# Patient Record
Sex: Male | Born: 1945 | Race: White | Hispanic: No | Marital: Married | Smoking: Never smoker
Health system: Southern US, Community
[De-identification: ages and names within clinical notes are randomized; demographics above are authoritative.]

## PROBLEM LIST (undated history)

## (undated) DIAGNOSIS — Q6602 Congenital talipes equinovarus, left foot: Secondary | ICD-10-CM

## (undated) DIAGNOSIS — Q6689 Other  specified congenital deformities of feet: Secondary | ICD-10-CM

## (undated) DIAGNOSIS — L0501 Pilonidal cyst with abscess: Secondary | ICD-10-CM

## (undated) DIAGNOSIS — Q6601 Congenital talipes equinovarus, right foot: Secondary | ICD-10-CM

## (undated) DIAGNOSIS — I10 Essential (primary) hypertension: Secondary | ICD-10-CM

## (undated) DIAGNOSIS — J309 Allergic rhinitis, unspecified: Secondary | ICD-10-CM

## (undated) DIAGNOSIS — N200 Calculus of kidney: Secondary | ICD-10-CM

## (undated) DIAGNOSIS — N400 Enlarged prostate without lower urinary tract symptoms: Secondary | ICD-10-CM

## (undated) DIAGNOSIS — B369 Superficial mycosis, unspecified: Secondary | ICD-10-CM

## (undated) HISTORY — DX: Allergic rhinitis, unspecified: J30.9

## (undated) HISTORY — DX: Congenital talipes equinovarus, left foot: Q66.02

## (undated) HISTORY — PX: CLUB FOOT RELEASE: SHX1363

## (undated) HISTORY — DX: Superficial mycosis, unspecified: B36.9

## (undated) HISTORY — DX: Benign prostatic hyperplasia without lower urinary tract symptoms: N40.0

## (undated) HISTORY — DX: Pilonidal cyst with abscess: L05.01

## (undated) HISTORY — DX: Congenital talipes equinovarus, right foot: Q66.01

## (undated) HISTORY — DX: Other specified congenital deformities of feet: Q66.89

## (undated) HISTORY — DX: Calculus of kidney: N20.0

## (undated) HISTORY — DX: Essential (primary) hypertension: I10

---

## 2000-03-17 ENCOUNTER — Ambulatory Visit (HOSPITAL_COMMUNITY): Admission: RE | Admit: 2000-03-17 | Discharge: 2000-03-17 | Payer: Self-pay | Admitting: Orthopaedic Surgery

## 2000-03-17 ENCOUNTER — Encounter: Payer: Self-pay | Admitting: Orthopaedic Surgery

## 2005-11-11 ENCOUNTER — Ambulatory Visit: Payer: Self-pay | Admitting: Family Medicine

## 2007-01-25 DIAGNOSIS — T7840XA Allergy, unspecified, initial encounter: Secondary | ICD-10-CM | POA: Insufficient documentation

## 2007-01-28 ENCOUNTER — Ambulatory Visit: Payer: Self-pay | Admitting: Family Medicine

## 2007-01-28 DIAGNOSIS — I10 Essential (primary) hypertension: Secondary | ICD-10-CM

## 2007-01-28 DIAGNOSIS — Z85038 Personal history of other malignant neoplasm of large intestine: Secondary | ICD-10-CM

## 2007-01-28 HISTORY — DX: Essential (primary) hypertension: I10

## 2007-03-02 ENCOUNTER — Telehealth: Payer: Self-pay | Admitting: Family Medicine

## 2007-07-07 ENCOUNTER — Ambulatory Visit: Payer: Self-pay | Admitting: Family Medicine

## 2007-07-07 LAB — CONVERTED CEMR LAB
ALT: 22 units/L (ref 0–53)
AST: 21 units/L (ref 0–37)
Albumin: 4.1 g/dL (ref 3.5–5.2)
Alkaline Phosphatase: 56 units/L (ref 39–117)
BUN: 15 mg/dL (ref 6–23)
Basophils Absolute: 0 10*3/uL (ref 0.0–0.1)
Basophils Relative: 0.3 % (ref 0.0–1.0)
Bilirubin Urine: NEGATIVE
Bilirubin, Direct: 0.1 mg/dL (ref 0.0–0.3)
Blood in Urine, dipstick: NEGATIVE
CO2: 28 meq/L (ref 19–32)
Calcium: 9.1 mg/dL (ref 8.4–10.5)
Chloride: 105 meq/L (ref 96–112)
Cholesterol: 257 mg/dL (ref 0–200)
Creatinine, Ser: 1 mg/dL (ref 0.4–1.5)
Direct LDL: 189.3 mg/dL
Eosinophils Absolute: 0.2 10*3/uL (ref 0.0–0.7)
Eosinophils Relative: 3.1 % (ref 0.0–5.0)
GFR calc Af Amer: 98 mL/min
GFR calc non Af Amer: 81 mL/min
Glucose, Bld: 97 mg/dL (ref 70–99)
Glucose, Urine, Semiquant: NEGATIVE
HCT: 44.1 % (ref 39.0–52.0)
HDL: 32.8 mg/dL — ABNORMAL LOW (ref >39.0)
Hemoglobin: 15.1 g/dL (ref 13.0–17.0)
Ketones, urine, test strip: NEGATIVE
Lymphocytes Relative: 28 % (ref 12.0–46.0)
MCHC: 34.2 g/dL (ref 30.0–36.0)
MCV: 88.6 fL (ref 78.0–100.0)
Monocytes Absolute: 0.7 10*3/uL (ref 0.1–1.0)
Monocytes Relative: 10.7 % (ref 3.0–12.0)
Neutro Abs: 3.7 10*3/uL (ref 1.4–7.7)
Neutrophils Relative %: 57.9 % (ref 43.0–77.0)
Nitrite: NEGATIVE
PSA: 0.86 ng/mL (ref 0.10–4.00)
Platelets: 224 10*3/uL (ref 150–400)
Potassium: 3.8 meq/L (ref 3.5–5.1)
Protein, U semiquant: NEGATIVE
RBC: 4.98 M/uL (ref 4.22–5.81)
RDW: 11.9 % (ref 11.5–14.6)
Sodium: 140 meq/L (ref 135–145)
Specific Gravity, Urine: 1.015
TSH: 1.5 microintl units/mL (ref 0.35–5.50)
Total Bilirubin: 1.3 mg/dL — ABNORMAL HIGH (ref 0.3–1.2)
Total CHOL/HDL Ratio: 7.8
Total Protein: 6.4 g/dL (ref 6.0–8.3)
Triglycerides: 102 mg/dL (ref 0–149)
Urobilinogen, UA: 0.2
VLDL: 20 mg/dL (ref 0–40)
WBC Urine, dipstick: NEGATIVE
WBC: 6.4 10*3/uL (ref 4.5–10.5)
pH: 5.5

## 2007-07-15 ENCOUNTER — Ambulatory Visit: Payer: Self-pay | Admitting: Family Medicine

## 2007-08-05 ENCOUNTER — Ambulatory Visit: Payer: Self-pay | Admitting: Internal Medicine

## 2007-08-06 DIAGNOSIS — L0501 Pilonidal cyst with abscess: Secondary | ICD-10-CM

## 2007-08-06 HISTORY — DX: Pilonidal cyst with abscess: L05.01

## 2007-08-11 ENCOUNTER — Telehealth: Payer: Self-pay | Admitting: *Deleted

## 2007-08-13 ENCOUNTER — Ambulatory Visit: Payer: Self-pay | Admitting: Family Medicine

## 2007-08-19 ENCOUNTER — Encounter: Payer: Self-pay | Admitting: Internal Medicine

## 2007-08-19 ENCOUNTER — Ambulatory Visit: Payer: Self-pay | Admitting: Internal Medicine

## 2007-08-20 ENCOUNTER — Telehealth: Payer: Self-pay | Admitting: Internal Medicine

## 2007-08-23 ENCOUNTER — Encounter: Payer: Self-pay | Admitting: Internal Medicine

## 2007-08-25 ENCOUNTER — Encounter: Admission: RE | Admit: 2007-08-25 | Discharge: 2007-08-25 | Payer: Self-pay | Admitting: Sports Medicine

## 2009-03-03 ENCOUNTER — Telehealth: Payer: Self-pay | Admitting: Family Medicine

## 2009-03-17 ENCOUNTER — Telehealth: Payer: Self-pay | Admitting: Family Medicine

## 2009-03-24 ENCOUNTER — Ambulatory Visit: Payer: Self-pay | Admitting: Family Medicine

## 2009-03-24 LAB — CONVERTED CEMR LAB
ALT: 22 U/L
AST: 23 U/L
Albumin: 4.3 g/dL
Alkaline Phosphatase: 68 U/L
BUN: 14 mg/dL
Basophils Absolute: 0 K/uL
Basophils Relative: 0.1 %
Bilirubin Urine: NEGATIVE
Bilirubin, Direct: 0.1 mg/dL
CO2: 29 meq/L
Calcium: 9.2 mg/dL
Chloride: 106 meq/L
Cholesterol: 241 mg/dL — ABNORMAL HIGH
Creatinine, Ser: 1.1 mg/dL
Direct LDL: 177.8 mg/dL
Eosinophils Absolute: 0.1 K/uL
Eosinophils Relative: 1.1 %
GFR calc non Af Amer: 71.8 mL/min
Glucose, Bld: 104 mg/dL — ABNORMAL HIGH
Glucose, Urine, Semiquant: NEGATIVE
HCT: 46.2 %
HDL: 45.3 mg/dL
Hemoglobin: 15.1 g/dL
Ketones, urine, test strip: NEGATIVE
Lymphocytes Relative: 19.4 %
Lymphs Abs: 1.3 K/uL
MCHC: 32.7 g/dL
MCV: 91.9 fL
Monocytes Absolute: 0.5 K/uL
Monocytes Relative: 7.7 %
Neutro Abs: 5 K/uL
Neutrophils Relative %: 71.7 %
Nitrite: NEGATIVE
PSA: 0.91 ng/mL
Platelets: 231 K/uL
Potassium: 4.2 meq/L
Protein, U semiquant: NEGATIVE
RBC: 5.03 M/uL
RDW: 12.2 %
Sodium: 140 meq/L
Specific Gravity, Urine: 1.015
TSH: 1.17 u[IU]/mL
Total Bilirubin: 0.9 mg/dL
Total CHOL/HDL Ratio: 5
Total Protein: 6.9 g/dL
Triglycerides: 118 mg/dL
Urobilinogen, UA: 0.2
VLDL: 23.6 mg/dL
WBC Urine, dipstick: NEGATIVE
WBC: 6.9 10*3/microliter
pH: 7

## 2009-06-19 ENCOUNTER — Ambulatory Visit: Payer: Self-pay | Admitting: Family Medicine

## 2009-07-23 ENCOUNTER — Emergency Department (HOSPITAL_COMMUNITY): Admission: EM | Admit: 2009-07-23 | Discharge: 2009-07-24 | Payer: Self-pay | Admitting: Emergency Medicine

## 2009-11-08 ENCOUNTER — Ambulatory Visit: Payer: Self-pay | Admitting: Family Medicine

## 2009-11-30 ENCOUNTER — Ambulatory Visit: Payer: Self-pay | Admitting: Family Medicine

## 2009-11-30 DIAGNOSIS — B369 Superficial mycosis, unspecified: Secondary | ICD-10-CM | POA: Insufficient documentation

## 2009-11-30 HISTORY — DX: Superficial mycosis, unspecified: B36.9

## 2010-03-27 NOTE — Assessment & Plan Note (Signed)
Summary: privacy issues//ccm   Vital Signs:  Patient profile:   65 year old male Weight:      191 pounds Temp:     98.1 degrees F oral BP sitting:   160 / 90  (left arm) Cuff size:   regular  Vitals Entered By: Kern Reap CMA Duncan Dull) (November 30, 2009 9:40 AM) CC: sores on penis   CC:  sores on penis.  History of Present Illness: Chad Yoder is a 65 year old, married male, nonsmoker, who comes in today for evaluation of irritation of the head of his penis over the last couple years.  He has episodes once or twice a year.  It usually occurs when he travels a lot.  It gets red and sore.  He tried different other appointments none of which is really worked except the anti-fungal ointment.  Allergies: 1)  ! * Codiene 2)  ! Sulfa  Past History:  Past medical, surgical, family and social histories (including risk factors) reviewed for relevance to current acute and chronic problems.  Past Medical History: Reviewed history from 08/13/2007 and no changes required. Bph ar Hypertension fractured left tibia clubfeet as a child had splinting and surgery pilonidal cyst  Family History: Reviewed history from 01/28/2007 and no changes required. Family History of Colon CA 1st degree relative <60 Family History Hypertension  Social History: Reviewed history from 01/28/2007 and no changes required. Retired Married Never Smoked Alcohol use-no Drug use-no Regular exercise-no  Review of Systems      See HPI  Physical Exam  General:  Well-developed,well-nourished,in no acute distress; alert,appropriate and cooperative throughout examination Genitalia:  he is uncircumcised.  When he pulls the skin, back, over the head of the penis.  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 Take 1 tablet by mouth every morning 2)  Flector 1.3 % Ptch (Diclofenac epolamine) .... As needed 3)  Allegra Allergy 180 Mg Tabs (Fexofenadine hcl) .... Once daily  Patient Instructions: 1)  apply OTC anti-fungal cream twice daily until the redness resolves. 2)  Please schedule a follow-up appointment as needed.    Prevention & Chronic Care Immunizations   Influenza vaccine: Not documented    Tetanus booster: 06/19/2009: Tdap    Pneumococcal vaccine: Not documented    H. zoster vaccine: Not documented  Colorectal Screening   Hemoccult: Not documented    Colonoscopy: Location:  [facility].    (08/19/2007)   Colonoscopy due: 08/2012  Other Screening   PSA: 0.91  (03/24/2009)   Smoking status: never  (01/28/2007)  Lipids   Total Cholesterol: 241  (03/24/2009)   LDL: DEL  (07/07/2007)   LDL Direct: 177.8  (03/24/2009)   HDL: 45.30  (03/24/2009)   Triglycerides: 118.0  (03/24/2009)  Hypertension   Last Blood Pressure: 160 / 90  (11/30/2009)   Serum creatinine: 1.1  (03/24/2009)   Serum potassium 4.2  (03/24/2009)  Self-Management Support :    Hypertension self-management support: Not documented

## 2010-03-27 NOTE — Assessment & Plan Note (Signed)
Summary: INFECTION ON TAILBONE//SLM   Vital Signs:  Patient profile:   65 year old male Weight:      195 pounds Temp:     98.4 degrees F BP sitting:   160 / 68  (right arm) Cuff size:   regular  Vitals Entered By: Kathrynn Speed CMA (November 08, 2009 10:39 AM) CC: infection on tailbone, src   CC:  infection on tailbone and src.  History of Present Illness: Chad Yoder is a 65 year old, married male, nonsmoker, who comes in today for evaluation of soreness around his tailbone for the past two weeks.  He said recurrent episodes of pilonidal cyst inflammation.  It is never had a completely excised.  He has no fever, chills, distant tenderness.  Current Medications (verified): 1)  Zestril 40 Mg  Tabs (Lisinopril) .... Take 1 Tablet By Mouth Every Morning 2)  Flector 1.3 % Ptch (Diclofenac Epolamine) .... As Needed 3)  Allegra Allergy 180 Mg Tabs (Fexofenadine Hcl) .... Once Daily  Allergies (verified): 1)  ! * Codiene 2)  ! Sulfa  Past History:  Past medical, surgical, family and social histories (including risk factors) reviewed for relevance to current acute and chronic problems.  Past Medical History: Reviewed history from 08/13/2007 and no changes required. Bph ar Hypertension fractured left tibia clubfeet as a child had splinting and surgery pilonidal cyst  Family History: Reviewed history from 01/28/2007 and no changes required. Family History of Colon CA 1st degree relative <60 Family History Hypertension  Social History: Reviewed history from 01/28/2007 and no changes required. Retired Married Never Smoked Alcohol use-no Drug use-no Regular exercise-no  Review of Systems      See HPI  Physical Exam  General:  Well-developed,well-nourished,in no acute distress; alert,appropriate and cooperative throughout examination Skin:  there is some tenderness around the pilonidal area.  No frank abscess.  No erythema   Impression & Recommendations:  Problem #  1:  PILONIDAL CYST, WITH ABSCESS (ICD-685.0) Assessment Deteriorated  Orders: Prescription Created Electronically [phone])  Complete Medication List: 1)  Zestril 40 Mg Tabs (Lisinopril) .... Take 1 tablet by mouth every morning 2)  Flector 1.3 % Ptch (Diclofenac epolamine) .... As needed 3)  Allegra Allergy 180 Mg Tabs (Fexofenadine hcl) .... Once daily 4)  Keflex 500 Mg Caps (Cephalexin) .... 2 by mouth two times a day  Patient Instructions: 1)  begin Keflex 2/10 b.i.d. warm soaks 15 minutes twice daily.  Return p.r.n. Prescriptions: KEFLEX 500 MG CAPS (CEPHALEXIN) 2 by mouth two times a day  #40 x 1   Entered and Authorized by:   Roderick Pee MD   Signed by:   Roderick Pee MD on 11/08/2009   Method used:   Electronically to        [id number] (retail)       [address]       [place], [place]  [address]       Ph: [phone] or [phone]       Fax: [phone]   RxID:   [id number] KEFLEX 500 MG CAPS (CEPHALEXIN) 2 by mouth two times a day  #40 x 1   Entered and Authorized by:   Roderick Pee MD   Signed by:   Roderick Pee MD on 11/08/2009   Method used:   Print then Give to Patient   RxID:   [id number]

## 2010-03-27 NOTE — Progress Notes (Signed)
Summary: needs refill  Phone Note Call from Patient Call back at Home Phone [phone]   Caller: Patient-live call Summary of Call: pt is unable to come in for appt. Please refill his bp meds until he can be seen. [facility]. (Pt is aware that his rx was denied because needed an appt.) Initial call taken by: Warnell Forester,  March 17, 2009 1:36 PM  Follow-up for Phone Call        appointment made.  asked patient to come in fasting. Follow-up by: Kern Reap CMA Duncan Dull),  March 17, 2009 3:20 PM    Prescriptions: ZESTRIL 40 MG  TABS (LISINOPRIL) Take 1 tablet by mouth every morning  #30 x 0   Entered by:   Kern Reap CMA (AAMA)   Authorized by:   Roderick Pee MD   Signed by:   Kern Reap CMA (AAMA) on 03/17/2009   Method used:   Electronically to        [id number] (retail)       [address]       [place], [place]  [address]       Ph: [phone] or [phone]       Fax: [phone]   RxID:   [id number]

## 2010-03-27 NOTE — Assessment & Plan Note (Signed)
Summary: follow up meds - rv   Vital Signs:  Patient profile:   65 year old male Height:      68.5 inches Weight:      196 pounds BMI:     29.47 Temp:     98.5 degrees F oral BP sitting:   120 / 88  (left arm) Cuff size:   regular  Vitals Entered By: Kern Reap CMA Duncan Dull) (March 24, 2009 9:21 AM)  Reason for Visit follow up meds  History of Present Illness: Chad Yoder is a 65 year old male, who was last here about a year and a half ago, who comes in today because we would no longer continue to continue refill.  His medication without physical exam he is not very happy.  I explained that this is a requirement that we see him once a year for a complete physical examination and blood work.  He takes Zestril 40 mg daily.  BP by me 150/80.  He also takes Allegra 60 mg daily for allergic rhinitis.  Is otherwise in good health.  No problems except he has a skin tag on his neck that he would like excised.  The  Allergies: 1)  ! * Codiene 2)  ! Sulfa  Past History:  Past medical, surgical, family and social histories (including risk factors) reviewed for relevance to current acute and chronic problems.  Past Medical History: Reviewed history from 08/13/2007 and no changes required. Bph ar Hypertension fractured left tibia clubfeet as a child had splinting and surgery pilonidal cyst  Family History: Reviewed history from 01/28/2007 and no changes required. Family History of Colon CA 1st degree relative <60 Family History Hypertension  Social History: Reviewed history from 01/28/2007 and no changes required. Retired Married Never Smoked Alcohol use-no Drug use-no Regular exercise-no  Review of Systems      See HPI  Physical Exam  General:  Well-developed,well-nourished,in no acute distress; alert,appropriate and cooperative throughout examination Heart:  150/80, pulse 90, however, he slightly upset about having to come to the office   Impression &  Recommendations:  Problem # 1:  HYPERTENSION (ICD-401.9) Assessment Unchanged  The following medications were removed from the medication list:    Zestril 20 Mg Tabs (Lisinopril) .Marland Kitchen... Take 1 tablet by mouth every morning His updated medication list for this problem includes:    Zestril 40 Mg Tabs (Lisinopril) .Marland Kitchen... Take 1 tablet by mouth every morning  Orders: Venipuncture (16109) UA Dipstick w/o Micro (automated)  (81003) TLB-Lipid Panel (80061-LIPID) TLB-BMP (Basic Metabolic Panel-BMET) (80048-METABOL) TLB-CBC Platelet - w/Differential (85025-CBCD) TLB-Hepatic/Liver Function Pnl (80076-HEPATIC) TLB-TSH (Thyroid Stimulating Hormone) (84443-TSH) TLB-PSA (Prostate Specific Antigen) (84153-PSA)  Complete Medication List: 1)  Fexofenadine Hcl 60 Mg Tabs (Fexofenadine hcl) .... Take daily as needed for allergies 2)  Zestril 40 Mg Tabs (Lisinopril) .... Take 1 tablet by mouth every morning 3)  Doxycycline Hyclate 100 Mg Tabs (Doxycycline hyclate) .... Take 1 tablet by mouth two times a day 4)  Flector 1.3 % Ptch (Diclofenac epolamine) .... As needed  Patient Instructions: 1)  continue lisinopril 40 mg daily.  Check a morning blood pressure daily.  Return in 4 to 6 weeks for a 30 minute appointment. 2)  We will do all your laboratory.  Today Prescriptions: ZESTRIL 40 MG  TABS (LISINOPRIL) Take 1 tablet by mouth every morning  #100 x 3   Entered and Authorized by:   Roderick Pee MD   Signed by:   Roderick Pee MD on 03/24/2009  Method used:   Print then Give to Patient   RxID:   [id number] FEXOFENADINE HCL 60 MG TABS (FEXOFENADINE HCL) take daily as needed for allergies  #100 x 3   Entered and Authorized by:   Roderick Pee MD   Signed by:   Roderick Pee MD on 03/24/2009   Method used:   Print then Give to Patient   RxID:   [id number]   Laboratory Results   Urine Tests    Routine Urinalysis   Color: yellow Appearance: Clear Glucose: negative   (Normal  Range: Negative) Bilirubin: negative   (Normal Range: Negative) Ketone: negative   (Normal Range: Negative) Spec. Gravity: 1.015   (Normal Range: 1.003-1.035) Blood: trace-intact   (Normal Range: Negative) pH: 7.0   (Normal Range: 5.0-8.0) Protein: negative   (Normal Range: Negative) Urobilinogen: 0.2   (Normal Range: 0-1) Nitrite: negative   (Normal Range: Negative) Leukocyte Esterace: negative   (Normal Range: Negative)    Comments: Rita Ohara  March 24, 2009 2:22 PM

## 2010-03-27 NOTE — Progress Notes (Signed)
Summary: refill  Phone Note Refill Request Message from:  Fax from Pharmacy  Refills Requested: Medication #1:  ZESTRIL 20 MG  TABS Take 1 tablet by mouth every morning   Brand Name Necessary? No   Last Ref[facility]: [facility] [facility]----[phone]      fax-----[phone]  Initial call taken by: Warnell Forester,  March 03, 2009 8:23 AM  Follow-up for Phone Call        denied Follow-up by: Kern Reap CMA Duncan Dull),  March 03, 2009 1:33 PM

## 2010-03-27 NOTE — Assessment & Plan Note (Signed)
Summary: 6 WK ROV // RS   Vital Signs:  Patient profile:   65 year old male Height:      68.5 inches Weight:      194 pounds Temp:     98.4 degrees F oral BP sitting:   140 / 80  (left arm) Cuff size:   regular  Vitals Entered By: Kern Reap CMA Duncan Dull) (June 19, 2009 2:44 PM) CC: follow-up visit   CC:  follow-up visit.  History of Present Illness: Chad Yoder is a 65 year old, married male, nonsmoker, who comes in today for evaluation of hypertension, and allergic rhinitis.  His hypertension is treated with Zestril 40 mg daily.  BP 140/80.  He takes Allegra 60 mg daily for allergic rhinitis.  he gets routine eye  care, and dental care.  Colonoscopy normal in GI.  Tetanus booster?.  He declines flu shot  he declines a prostate exam he states his urologist will do this for him  Allergies: 1)  ! * Codiene 2)  ! Sulfa  Past History:  Past medical, surgical, family and social histories (including risk factors) reviewed, and no changes noted (except as noted below).  Past Medical History: Reviewed history from 08/13/2007 and no changes required. Bph ar Hypertension fractured left tibia clubfeet as a child had splinting and surgery pilonidal cyst  Family History: Reviewed history from 01/28/2007 and no changes required. Family History of Colon CA 1st degree relative <60 Family History Hypertension  Social History: Reviewed history from 01/28/2007 and no changes required. Retired Married Never Smoked Alcohol use-no Drug use-no Regular exercise-no  Review of Systems      See HPI  Physical Exam  General:  Well-developed,well-nourished,in no acute distress; alert,appropriate and cooperative throughout examination Head:  Normocephalic and atraumatic without obvious abnormalities. No apparent alopecia or balding. Eyes:  No corneal or conjunctival inflammation noted. EOMI. Perrla. Funduscopic exam benign, without hemorrhages, exudates or papilledema. Vision grossly  normal. Ears:  External ear exam shows no significant lesions or deformities.  Otoscopic examination reveals clear canals, tympanic membranes are intact bilaterally without bulging, retraction, inflammation or discharge. Hearing is grossly normal bilaterally. Nose:  External nasal examination shows no deformity or inflammation. Nasal mucosa are pink and moist without lesions or exudates. Mouth:  Oral mucosa and oropharynx without lesions or exudates.  Teeth in good repair. Neck:  No deformities, masses, or tenderness noted. Chest Wall:  No deformities, masses, tenderness or gynecomastia noted. Breasts:  No masses or gynecomastia noted Lungs:  Normal respiratory effort, chest expands symmetrically. Lungs are clear to auscultation, no crackles or wheezes. Heart:  Normal rate and regular rhythm. S1 and S2 normal without gallop, murmur, click, rub or other extra sounds. Abdomen:  Bowel sounds positive,abdomen soft and non-tender without masses, organomegaly or hernias noted. Rectal:  No external abnormalities noted. Normal sphincter tone. No rectal masses or tenderness. Genitalia:  Testes bilaterally descended without nodularity, tenderness or masses. No scrotal masses or lesions. No penis lesions or urethral discharge. Prostate:  Prostate gland firm and smooth, no enlargement, nodularity, tenderness, mass, asymmetry or induration. Msk:  No deformity or scoliosis noted of thoracic or lumbar spine.   Pulses:  R and L carotid,radial,femoral,dorsalis pedis and posterior tibial pulses are full and equal bilaterally Extremities:  No clubbing, cyanosis, edema, or deformity noted with normal full range of motion of all joints.   Neurologic:  No cranial nerve deficits noted. Station and gait are normal. Plantar reflexes are down-going bilaterally. DTRs are symmetrical throughout. Sensory, motor and  coordinative functions appear intact. Skin:  Intact without suspicious lesions or rashes Cervical Nodes:  No  lymphadenopathy noted Axillary Nodes:  No palpable lymphadenopathy Inguinal Nodes:  No significant adenopathy Psych:  Cognition and judgment appear intact. Alert and cooperative with normal attention span and concentration. No apparent delusions, illusions, hallucinations   Impression & Recommendations:  Problem # 1:  HYPERTENSION (ICD-401.9) Assessment Improved  His updated medication list for this problem includes:    Zestril 40 Mg Tabs (Lisinopril) .Marland Kitchen... Take 1 tablet by mouth every morning  Orders: EKG w/ Interpretation (93000)  Problem # 2:  Preventive Health Care (ICD-V70.0) Assessment: Unchanged  Orders: EKG w/ Interpretation (93000)  Complete Medication List: 1)  Fexofenadine Hcl 60 Mg Tabs (Fexofenadine hcl) .... Take daily as needed for allergies 2)  Zestril 40 Mg Tabs (Lisinopril) .... Take 1 tablet by mouth every morning 3)  Flector 1.3 % Ptch (Diclofenac epolamine) .... As needed  Other Orders: Tdap => 78yrs IM (65784) Admin 1st Vaccine (69629)  Patient Instructions: 1)  Please schedule a follow-up appointment in 1 year. Prescriptions: ZESTRIL 40 MG  TABS (LISINOPRIL) Take 1 tablet by mouth every morning  #100 x 3   Entered and Authorized by:   Roderick Pee MD   Signed by:   Roderick Pee MD on 06/19/2009   Method used:   Print then Give to Patient   RxID:   [id number] FEXOFENADINE HCL 60 MG TABS (FEXOFENADINE HCL) take daily as needed for allergies  #100 x 3   Entered and Authorized by:   Roderick Pee MD   Signed by:   Roderick Pee MD on 06/19/2009   Method used:   Print then Give to Patient   RxID:   [id number] ZESTRIL 40 MG  TABS (LISINOPRIL) Take 1 tablet by mouth every morning  #100 x 3   Entered and Authorized by:   Roderick Pee MD   Signed by:   Roderick Pee MD on 06/19/2009   Method used:   Electronically to        [facility]* (retail)       [address]       [place], [place]  [address]       Ph:  [phone]       Fax: [phone]   RxID:   [id number] FEXOFENADINE HCL 60 MG TABS (FEXOFENADINE HCL) take daily as needed for allergies  #100 x 3   Entered and Authorized by:   Roderick Pee MD   Signed by:   Roderick Pee MD on 06/19/2009   Method used:   Electronically to        [facility]* (retail)       [address]       [place], [place]  [address]       Ph: [phone]       Fax: [phone]   RxID:   [id number]    Immunizations Administered:  Tetanus Vaccine:    Vaccine Type: Tdap    Site: right deltoid    Mfr: [employer]    Dose: 0.5 ml    Route: IM    Given by: Kern Reap CMA (AAMA)    Exp. Date: 05/20/2011    Lot #: [id number]    Physician counseled: yes

## 2010-04-18 ENCOUNTER — Other Ambulatory Visit: Payer: Self-pay | Admitting: Dermatology

## 2010-05-14 LAB — URINALYSIS, ROUTINE W REFLEX MICROSCOPIC
Bilirubin Urine: NEGATIVE
Leukocytes, UA: NEGATIVE
Nitrite: NEGATIVE
Specific Gravity, Urine: 1.029 (ref 1.005–1.030)
Urobilinogen, UA: 0.2 mg/dL (ref 0.0–1.0)

## 2010-05-14 LAB — BASIC METABOLIC PANEL
BUN: 19 mg/dL (ref 6–23)
Creatinine, Ser: 1.23 mg/dL (ref 0.4–1.5)
GFR calc Af Amer: >60 mL/min (ref >60)
GFR calc non Af Amer: 59 mL/min — ABNORMAL LOW (ref >60)
Potassium: 4 mEq/L (ref 3.5–5.1)

## 2010-05-14 LAB — DIFFERENTIAL
Eosinophils Relative: 1 % (ref 0–5)
Lymphocytes Relative: 15 % (ref 12–46)
Lymphs Abs: 1.7 10*3/uL (ref 0.7–4.0)
Neutro Abs: 9 10*3/uL — ABNORMAL HIGH (ref 1.7–7.7)
Neutrophils Relative %: 79 % — ABNORMAL HIGH (ref 43–77)

## 2010-05-14 LAB — URINE MICROSCOPIC-ADD ON

## 2010-05-14 LAB — CBC
HCT: 41.3 % (ref 39.0–52.0)
Platelets: 232 10*3/uL (ref 150–400)
WBC: 11.3 10*3/uL — ABNORMAL HIGH (ref 4.0–10.5)

## 2010-05-14 LAB — URINE CULTURE: Culture: NO GROWTH

## 2010-08-07 ENCOUNTER — Other Ambulatory Visit: Payer: Self-pay | Admitting: Family Medicine

## 2010-10-04 ENCOUNTER — Other Ambulatory Visit: Payer: Self-pay | Admitting: Family Medicine

## 2010-10-04 MED ORDER — CEPHALEXIN 500 MG PO CAPS: 500.0000 mg | ORAL_CAPSULE | Freq: Two times a day (BID) | ORAL | Status: AC

## 2010-10-04 NOTE — Telephone Encounter (Signed)
Keflex 500 mg dispense 40 tabs directions two p.o. B.i.d., refills x 1

## 2010-10-04 NOTE — Telephone Encounter (Signed)
Left message on machine for patient  rx sent 

## 2010-10-04 NOTE — Telephone Encounter (Signed)
Pt is taking trip out of country and is req to get a script for Cephalexin 500mg . Pls call in to [facility]

## 2010-11-04 IMAGING — CT CT ABD-PELV W/O CM
2 of 4 series · 17 of 46 positions shown, 19 images · non-contrast
Comparison: None.

CLINICAL DATA: Left flank pain and hematuria.

CT ABDOMEN AND PELVIS WITHOUT CONTRAST
TECHNIQUE: Multidetector CT imaging of the abdomen and pelvis was
performed following the standard protocol without intravenous
contrast.

[Series 2: under 200# stone no prev · axial · 0.74mm/px · z∈[-456,-86]mm · 14 of 82 slices shown, 16 images]
[im 4/82  soft-tissue]
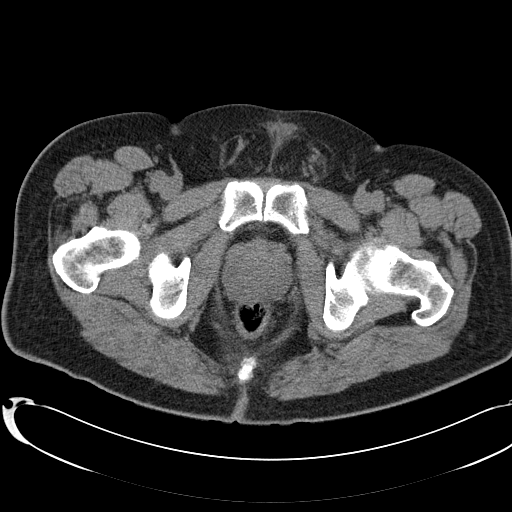
[im 4/82  bone]
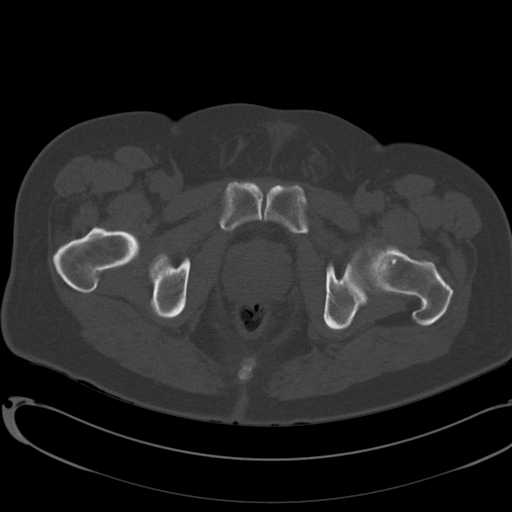
[im 10/82  soft-tissue]
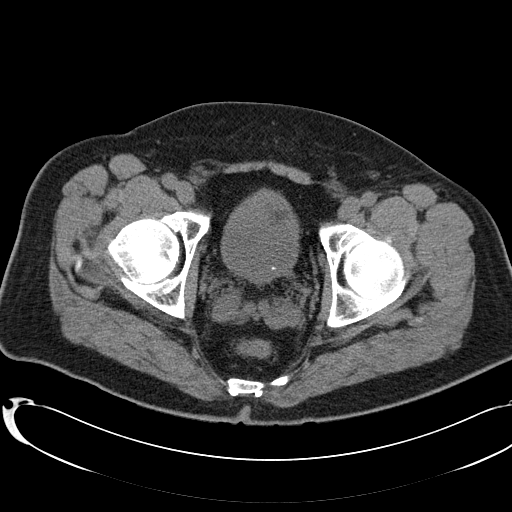
[im 17/82  soft-tissue]
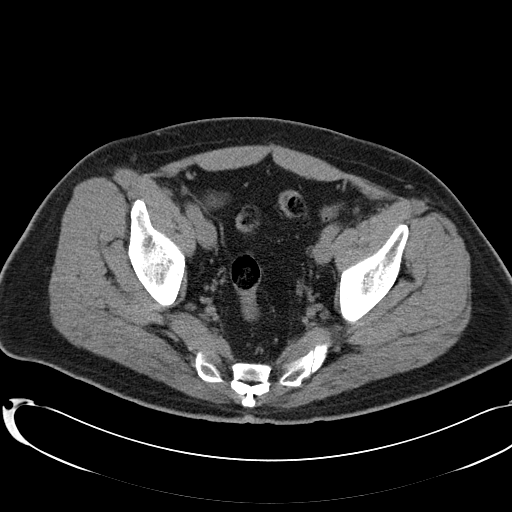
[im 23/82  soft-tissue]
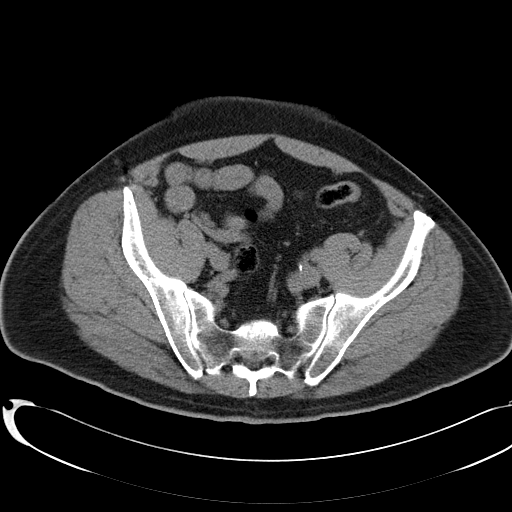
[im 26/82  soft-tissue]
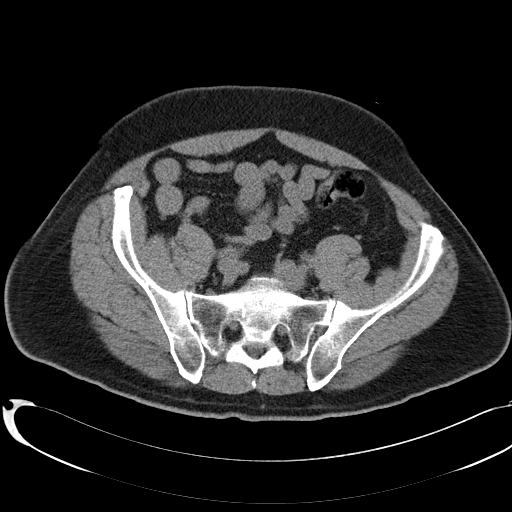
[im 33/82  soft-tissue]
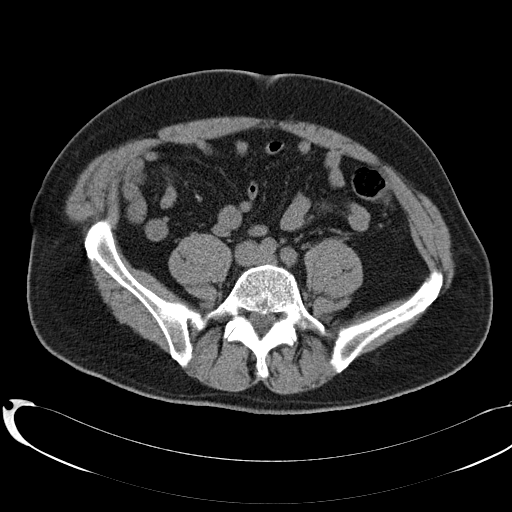
[im 39/82  soft-tissue]
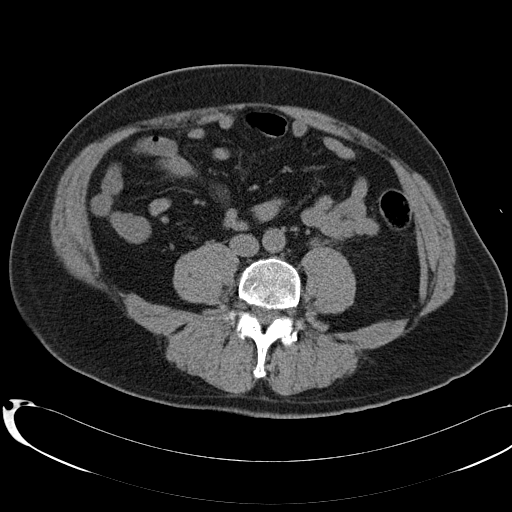
[im 43/82  soft-tissue]
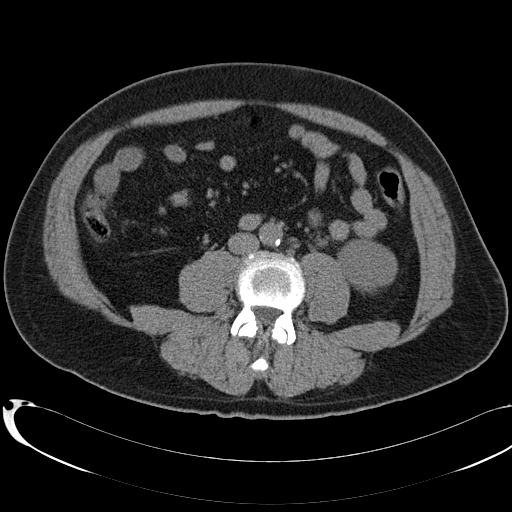
[im 49/82  soft-tissue]
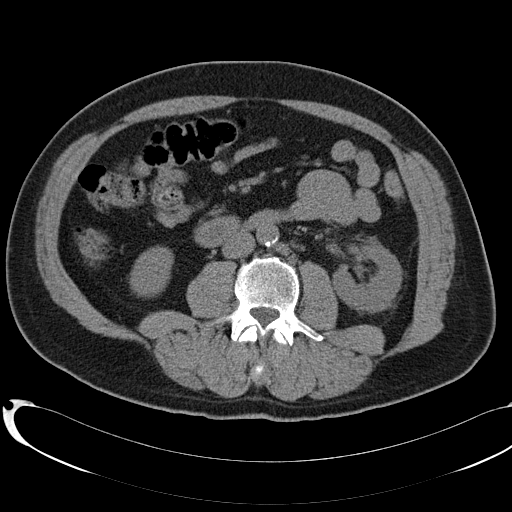
[im 49/82  bone]
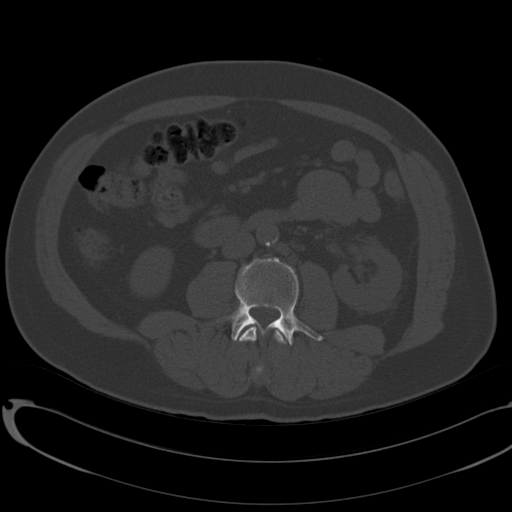
[im 56/82  soft-tissue]
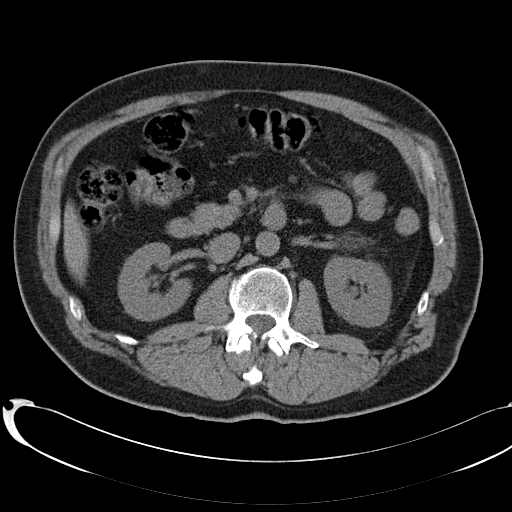
[im 62/82  soft-tissue]
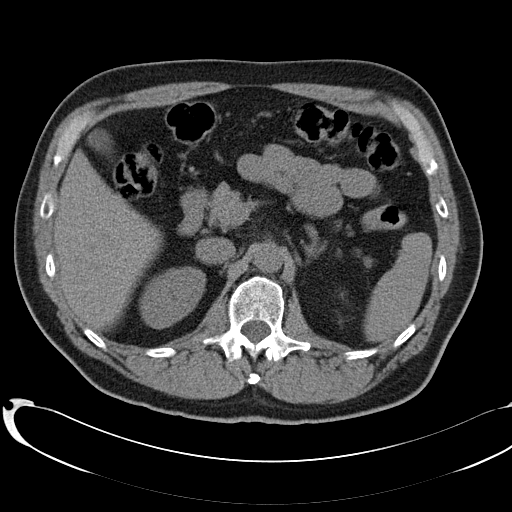
[im 65/82  soft-tissue]
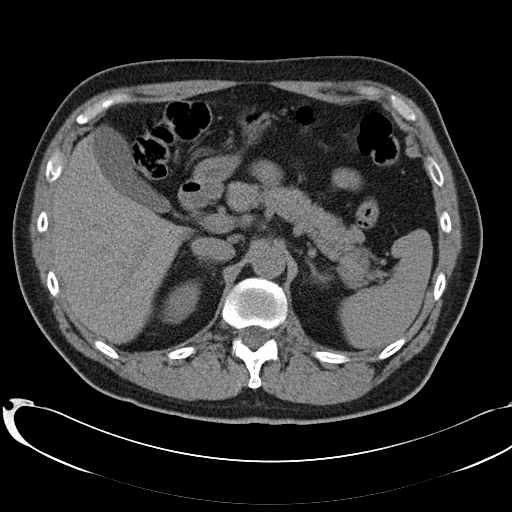
[im 72/82  soft-tissue]
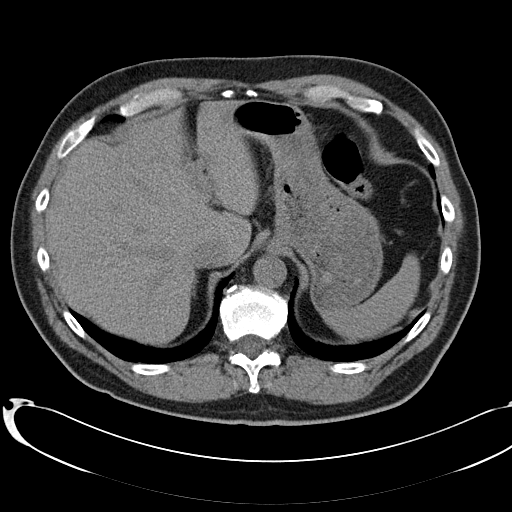
[im 78/82  soft-tissue]
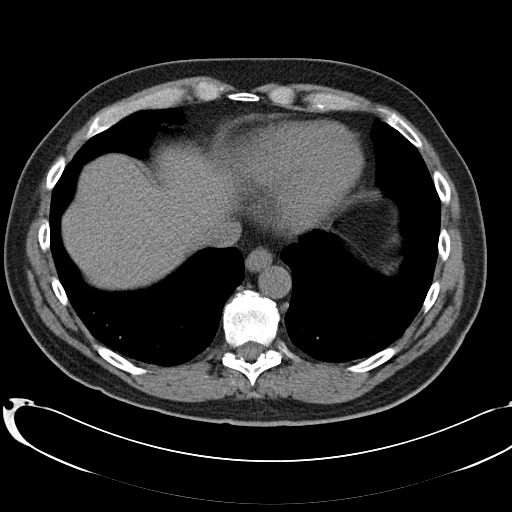

[Series 602: <mpr thick range> · coronal · 0.83mm/px · 3 of 77 slices shown]
[im 26/77  soft-tissue]
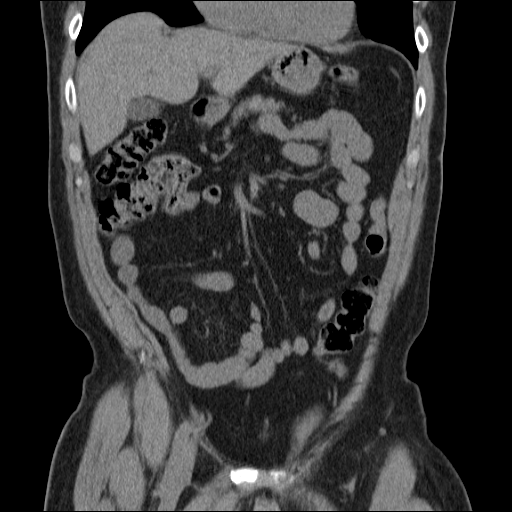
[im 34/77  soft-tissue]
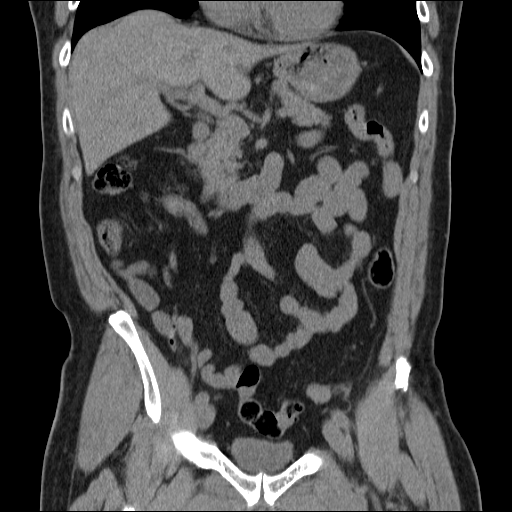
[im 43/77  soft-tissue]
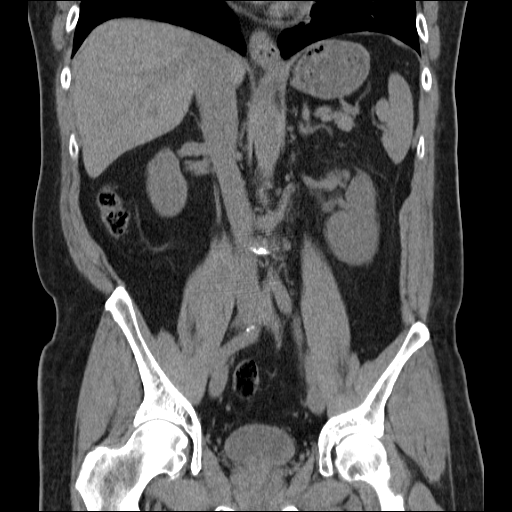

[17 of 46 positions shown; findings below may reference images not displayed]

FINDINGS: There is some dependent atelectatic change in the lung
bases.  No pleural or pericardial effusion.

There is very mild left hydronephrosis with some stranding about
the left kidney and ureter.  A punctate stone is seen at the left
ureterovesicle junction measuring approximately 0.2 cm.  No other
renal or ureteral stones are identified.

The liver, gallbladder, spleen, adrenal glands and pancreas all
appear normal.  The stomach and small bowel are unremarkable.  A
few sigmoid diverticula are noted but there is no evidence of
diverticulitis.  The appendix appears normal.  No lymphadenopathy
or fluid.  Prostate gland is mildly enlarged.  No focal bony
abnormality.
IMPRESSION: 1.  Mild left hydronephrosis due to a 0.2 cm stone at the left UVJ.
2.  Mild enlargement of the prostate gland.
3.  Mild sigmoid diverticulosis without diverticulitis.

## 2010-12-16 ENCOUNTER — Other Ambulatory Visit: Payer: Self-pay | Admitting: Family Medicine

## 2010-12-26 ENCOUNTER — Encounter: Payer: Self-pay | Admitting: Family Medicine

## 2010-12-26 LAB — HM COLONOSCOPY: HM Colonoscopy: 2014

## 2010-12-27 ENCOUNTER — Encounter: Payer: Self-pay | Admitting: Family Medicine

## 2010-12-27 ENCOUNTER — Ambulatory Visit (INDEPENDENT_AMBULATORY_CARE_PROVIDER_SITE_OTHER): Payer: Medicare Other | Admitting: Family Medicine

## 2010-12-27 DIAGNOSIS — I10 Essential (primary) hypertension: Secondary | ICD-10-CM

## 2010-12-27 DIAGNOSIS — Z0000 Encounter for general adult medical examination without abnormal findings: Secondary | ICD-10-CM

## 2010-12-27 DIAGNOSIS — Z136 Encounter for screening for cardiovascular disorders: Secondary | ICD-10-CM

## 2010-12-27 DIAGNOSIS — J309 Allergic rhinitis, unspecified: Secondary | ICD-10-CM

## 2010-12-27 DIAGNOSIS — Z23 Encounter for immunization: Secondary | ICD-10-CM

## 2010-12-27 LAB — LIPID PANEL
Cholesterol: 235 mg/dL — ABNORMAL HIGH (ref 0–200)
HDL: 43.3 mg/dL (ref >39.00)
Total CHOL/HDL Ratio: 5
Triglycerides: 71 mg/dL (ref 0.0–149.0)
VLDL: 14.2 mg/dL (ref 0.0–40.0)

## 2010-12-27 LAB — TSH: TSH: 1.48 u[IU]/mL (ref 0.35–5.50)

## 2010-12-27 LAB — POCT URINALYSIS DIPSTICK
Bilirubin, UA: NEGATIVE
Blood, UA: NEGATIVE
Glucose, UA: NEGATIVE
Ketones, UA: NEGATIVE
Leukocytes, UA: NEGATIVE
Nitrite, UA: NEGATIVE
Protein, UA: NEGATIVE
Spec Grav, UA: 1.025
Urobilinogen, UA: 0.2
pH, UA: 5.5

## 2010-12-27 LAB — HEPATIC FUNCTION PANEL
Albumin: 4.4 g/dL (ref 3.5–5.2)
Total Protein: 6.9 g/dL (ref 6.0–8.3)

## 2010-12-27 LAB — BASIC METABOLIC PANEL
BUN: 17 mg/dL (ref 6–23)
CO2: 29 mEq/L (ref 19–32)
Calcium: 9.3 mg/dL (ref 8.4–10.5)
Chloride: 103 mEq/L (ref 96–112)
Creatinine, Ser: 0.8 mg/dL (ref 0.4–1.5)
GFR: 97.46 mL/min (ref >60.00)
Glucose, Bld: 101 mg/dL — ABNORMAL HIGH (ref 70–99)
Potassium: 4.6 mEq/L (ref 3.5–5.1)
Sodium: 139 mEq/L (ref 135–145)

## 2010-12-27 LAB — CBC WITH DIFFERENTIAL/PLATELET
Basophils Relative: 0.4 % (ref 0.0–3.0)
Eosinophils Absolute: 0.1 10*3/uL (ref 0.0–0.7)
Eosinophils Relative: 1.6 % (ref 0.0–5.0)
Lymphocytes Relative: 24.5 % (ref 12.0–46.0)
Neutrophils Relative %: 64.1 % (ref 43.0–77.0)
Platelets: 232 10*3/uL (ref 150.0–400.0)
RBC: 5.06 Mil/uL (ref 4.22–5.81)
WBC: 6.5 10*3/uL (ref 4.5–10.5)

## 2010-12-27 LAB — PSA: PSA: 1.23 ng/mL (ref 0.10–4.00)

## 2010-12-27 MED ORDER — LISINOPRIL 40 MG PO TABS: 40.0000 mg | ORAL_TABLET | Freq: Every day | ORAL | Status: DC

## 2010-12-27 MED ORDER — NYSTATIN-TRIAMCINOLONE 100000-0.1 UNIT/GM-% EX OINT: 1.0000 "application " | TOPICAL_OINTMENT | Freq: Two times a day (BID) | CUTANEOUS | Status: DC

## 2010-12-27 NOTE — Progress Notes (Signed)
  Subjective:    Patient ID: Chad Yoder, male    DOB: Mar 03, 1945, 65 y.o.   MRN: [id number]  HPI Chad Yoder is a 65 -year-old married male, nonsmoker comes in today for Medicare wellness examination because of a history of underlying hypertension.  He takes lisinopril 40 mg daily BP 130/80.  He also takes Allegra one daily for allergic rhinitis.  He also takes antifungal cream, but the steroid cream for jock itch.  He has his prostate checked by the urologist because he has a history of BPH.  He gets routine eye care, hearing normal, regular dental care, colonoscopy recently, was normal, except he states he had a febrile reaction to his last colonoscopy.  Tetanus booster 2011, declines the flu shot because he says he has a allergy.  Information given on shingles.  Pneumonia vaccine given today.  Cognitive function, normal activities of daily living.  Normal.  He walks on a regular basis.  Home health safety reviewed.  No issues identified.  No guns in the house, healthcare power of attorney and living Will have been completed.   Review of Systems  Constitutional: Negative.   HENT: Negative.   Eyes: Negative.   Respiratory: Negative.   Cardiovascular: Negative.   Gastrointestinal: Negative.   Genitourinary: Negative.   Musculoskeletal: Negative.   Skin: Negative.   Neurological: Negative.   Hematological: Negative.   Psychiatric/Behavioral: Negative.        Objective:   Physical Exam  Constitutional: He is oriented to person, place, and time. He appears well-developed and well-nourished.  HENT:  Head: Normocephalic and atraumatic.  Right Ear: External ear normal.  Left Ear: External ear normal.  Nose: Nose normal.  Mouth/Throat: Oropharynx is clear and moist.  Eyes: Conjunctivae and EOM are normal. Pupils are equal, round, and reactive to light.  Neck: Normal range of motion. Neck supple. No JVD present. No tracheal deviation present. No thyromegaly present.    Cardiovascular: Normal rate, regular rhythm, normal heart sounds and intact distal pulses.  Exam reveals no gallop and no friction rub.   No murmur heard. Pulmonary/Chest: Effort normal and breath sounds normal. No stridor. No respiratory distress. He has no wheezes. He has no rales. He exhibits no tenderness.  Abdominal: Soft. Bowel sounds are normal. He exhibits no distension and no mass. There is no tenderness. There is no rebound and no guarding.  Genitourinary:       Genital rectal exam done by urologist  Musculoskeletal: Normal range of motion. He exhibits no edema and no tenderness.  Lymphadenopathy:    He has no cervical adenopathy.  Neurological: He is alert and oriented to person, place, and time. He has normal reflexes. No cranial nerve deficit. He exhibits normal muscle tone.  Skin: Skin is warm and dry. No rash noted. No erythema. No pallor.  Psychiatric: He has a normal mood and affect. His behavior is normal. Judgment and thought content normal.          Assessment & Plan:  Healthy male.  Hypertension.  Continue lisinopril 40 mg daily.  Allergic rhinitis switch to plain Zyrtec 10 mg nightly  History of BPH followed by urology.  Return in one year, sooner for any problems.  Fungal infection anti-fungal ointment, and triamcinolone as prescribed by urology

## 2010-12-27 NOTE — Patient Instructions (Signed)
Continue your good health habits.  Return in one year for general Medicare wellness exam sooner if any problems

## 2010-12-28 ENCOUNTER — Telehealth: Payer: Self-pay | Admitting: *Deleted

## 2010-12-28 LAB — LDL CHOLESTEROL, DIRECT: Direct LDL: 188 mg/dL

## 2010-12-28 NOTE — Telephone Encounter (Signed)
Pt's wife wants to know if her husband ( had a pneumonia vaccine this week) is safe to be around their 3 month old grandson?????  Is this true?  May leave a message pm either phone documented in the chart

## 2010-12-31 NOTE — Telephone Encounter (Signed)
ok 

## 2010-12-31 NOTE — Telephone Encounter (Signed)
Spoke with patient's wife.

## 2011-09-17 ENCOUNTER — Ambulatory Visit (INDEPENDENT_AMBULATORY_CARE_PROVIDER_SITE_OTHER): Payer: Medicare Other | Admitting: Family

## 2011-09-17 ENCOUNTER — Encounter: Payer: Self-pay | Admitting: Family

## 2011-09-17 VITALS — HR 125 | Temp 98.8°F | Wt 194.0 lb

## 2011-09-17 DIAGNOSIS — J209 Acute bronchitis, unspecified: Secondary | ICD-10-CM

## 2011-09-17 DIAGNOSIS — R05 Cough: Secondary | ICD-10-CM

## 2011-09-17 MED ORDER — PREDNISONE 20 MG PO TABS: ORAL_TABLET | ORAL | Status: AC

## 2011-09-17 NOTE — Patient Instructions (Signed)

## 2011-09-17 NOTE — Progress Notes (Signed)
  Subjective:    Patient ID: Chad Yoder, male    DOB: Jun 13, 1945, 66 y.o.   MRN: [id number]  Cough This is a new problem. The current episode started in the past 7 days. The problem has been gradually worsening. Episode frequency: Worse at night. The cough is non-productive (clear sputum). Associated symptoms include postnasal drip and wheezing. Nothing aggravates the symptoms. He has tried nothing for the symptoms. His past medical history is significant for bronchitis.  Wheezing  Associated symptoms include coughing.      Review of Systems  Constitutional: Negative.   HENT: Positive for postnasal drip.   Respiratory: Positive for cough and wheezing.   Cardiovascular: Negative.   Gastrointestinal: Negative.   Genitourinary: Negative.   Musculoskeletal: Negative.   Skin: Negative.   Neurological: Negative.   Hematological: Negative.   Psychiatric/Behavioral: Negative.        Objective:   Physical Exam  Constitutional: He is oriented to person, place, and time. He appears well-developed and well-nourished.  HENT:  Right Ear: External ear normal.  Left Ear: External ear normal.  Nose: Nose normal.  Mouth/Throat: Oropharynx is clear and moist.  Neck: Normal range of motion. Neck supple.  Cardiovascular: Normal rate, regular rhythm and normal heart sounds.   Pulmonary/Chest: Effort normal and breath sounds normal.  Neurological: He is alert and oriented to person, place, and time.  Skin: Skin is warm and dry.  Psychiatric: He has a normal mood and affect.          Assessment & Plan:  Assessment: Bronchitis, Cough  Plan: Prednisone 60mg x3, 40mg x3, 20mg x3. Call the office if symptoms worsen or persist. Recheck with PCP as scheduled and sooner prn.

## 2011-10-16 ENCOUNTER — Encounter: Payer: Self-pay | Admitting: Family Medicine

## 2011-10-16 ENCOUNTER — Ambulatory Visit (INDEPENDENT_AMBULATORY_CARE_PROVIDER_SITE_OTHER): Payer: Medicare Other | Admitting: Family Medicine

## 2011-10-16 VITALS — BP 140/80 | Temp 98.1°F | Wt 196.0 lb

## 2011-10-16 DIAGNOSIS — Z8709 Personal history of other diseases of the respiratory system: Secondary | ICD-10-CM | POA: Insufficient documentation

## 2011-10-16 DIAGNOSIS — L0591 Pilonidal cyst without abscess: Secondary | ICD-10-CM

## 2011-10-16 MED ORDER — ALBUTEROL SULFATE HFA 108 (90 BASE) MCG/ACT IN AERS: 2.0000 | INHALATION_SPRAY | Freq: Four times a day (QID) | RESPIRATORY_TRACT | Status: DC | PRN

## 2011-10-16 MED ORDER — CEPHALEXIN 500 MG PO CAPS: 500.0000 mg | ORAL_CAPSULE | Freq: Three times a day (TID) | ORAL | Status: AC

## 2011-10-16 MED ORDER — PREDNISONE 20 MG PO TABS: ORAL_TABLET | ORAL | Status: DC

## 2011-10-16 NOTE — Patient Instructions (Signed)
Flovent 44 mcg 2 puffs twice daily Rinse your mouth after each use

## 2011-10-16 NOTE — Progress Notes (Signed)
  Subjective:    Patient ID: Chad Yoder, male    DOB: [birth year], 66 y.o.   MRN: [id number]  HPI  Patient seen for the following items  History of pilonidal cyst. His had some infections previously which apparently been treated conservatively with soaks and antibiotics. About 10 days ago some increased soreness. No drainage. No fever or chills. No problems with sitting. Has not noted any redness. Previously has responded well to Keflex for her similar inflammation  History of asthma which is mild and intermittent. Preparing to housesit for relative and they have dogs. He has had wheezing in similar circumstances previously. Currently does not use any inhalers but requesting albuterol along with steroid inhaler for prevention. Nonsmoker. No current wheezing or cough.  Past Medical History  Diagnosis Date  . FUNGAL DERMATITIS 11/30/2009  . HYPERTENSION 01/28/2007  . PILONIDAL CYST, WITH ABSCESS 08/06/2007  . BPH (benign prostatic hyperplasia)   . AR (allergic rhinitis)   . 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 Kidney stone     06/2009   Past Surgical History  Procedure Date  . Club foot release     bilat - as a child    reports that he has never smoked. He does not have any smokeless tobacco history on file. His alcohol and drug histories not on file. family history is negative for Cancer and Hypertension. Allergies  Allergen Reactions  . Codeine     REACTION: N\T\ V  . Sulfonamide Derivatives     REACTION: rash      Review of Systems  Constitutional: Negative for fever and chills.  HENT: Negative for rhinorrhea, postnasal drip and sinus pressure.   Respiratory: Negative for cough, shortness of breath and wheezing.   Cardiovascular: Negative for chest pain.  Gastrointestinal: Negative for nausea and vomiting.  Skin: Negative for rash.       Objective:   Physical Exam  Constitutional: He appears well-developed and well-nourished.  HENT:  Right  Ear: External ear normal.  Left Ear: External ear normal.  Mouth/Throat: Oropharynx is clear and moist.  Cardiovascular: Normal rate and regular rhythm.   Pulmonary/Chest: Effort normal and breath sounds normal. No respiratory distress. He has no wheezes. He has no rales.  Genitourinary:       Pilonidal area is examined. He has no significant erythema and no fluctuance. Minimally tender. No drainage.          Assessment & Plan:  #1 pilonidal cyst. History of previous infection. Possibly some mild inflammation but no evidence for abscess this time. Cephalexin 500 mg 3 times a day for 10 days along with warm soaks. Followup promptly if signs of infection such as abscess #2 history of mild intermittent asthma. Refill albuterol for as needed use. Flovent 44 mcg 2 puffs twice daily for prevention and he will start 2 weeks prior to travel (see above).

## 2011-11-06 ENCOUNTER — Other Ambulatory Visit: Payer: Self-pay | Admitting: Family Medicine

## 2011-11-07 NOTE — Telephone Encounter (Signed)
Keflex last given at [facility] on 10-16-11, one tab TID, #30 with 0 refills

## 2011-11-12 ENCOUNTER — Ambulatory Visit (INDEPENDENT_AMBULATORY_CARE_PROVIDER_SITE_OTHER): Payer: Medicare Other | Admitting: Family Medicine

## 2011-11-12 ENCOUNTER — Encounter: Payer: Self-pay | Admitting: Family Medicine

## 2011-11-12 VITALS — BP 130/80 | Temp 98.2°F | Wt 194.0 lb

## 2011-11-12 DIAGNOSIS — N138 Other obstructive and reflux uropathy: Secondary | ICD-10-CM

## 2011-11-12 DIAGNOSIS — L0591 Pilonidal cyst without abscess: Secondary | ICD-10-CM | POA: Insufficient documentation

## 2011-11-12 DIAGNOSIS — N139 Obstructive and reflux uropathy, unspecified: Secondary | ICD-10-CM

## 2011-11-12 DIAGNOSIS — N401 Enlarged prostate with lower urinary tract symptoms: Secondary | ICD-10-CM

## 2011-11-12 DIAGNOSIS — I10 Essential (primary) hypertension: Secondary | ICD-10-CM

## 2011-11-12 MED ORDER — CEPHALEXIN 500 MG PO CAPS: ORAL_CAPSULE | ORAL | Status: DC

## 2011-11-12 NOTE — Progress Notes (Signed)
  Subjective:    Patient ID: Chad Yoder, male    DOB: 1945-06-19, 66 y.o.   MRN: [id number]  HPI Chad Yoder is a 66 year old married male nonsmoker who comes in today for evaluation of a palmomental cyst that's become infected  This is been a recurrent problem off and on he takes small doses of Keflex and typically his symptoms resolve in a short period of time. He's never had an abscess   Review of Systems Gen. review of systems otherwise negative    Objective:   Physical Exam Well-developed well-nourished male in no acute distress examination of the coccyx was normal except for some slight erythema and tenderness. No abscess       Assessment & Plan:  Pilonidal cyst with mild infection plan Keflex warm soaks return when necessary

## 2011-11-12 NOTE — Patient Instructions (Addendum)
Take the Keflex 2 tabs twice daily  Hot soaks twice daily  Return when necessary  Set up your physical examination for December labs one week prior

## 2011-12-25 ENCOUNTER — Other Ambulatory Visit: Payer: Self-pay | Admitting: Family Medicine

## 2012-02-12 ENCOUNTER — Ambulatory Visit (INDEPENDENT_AMBULATORY_CARE_PROVIDER_SITE_OTHER): Payer: Medicare Other | Admitting: Family Medicine

## 2012-02-12 ENCOUNTER — Encounter: Payer: Self-pay | Admitting: Family Medicine

## 2012-02-12 VITALS — BP 140/80 | Temp 97.9°F | Ht 69.5 in | Wt 195.0 lb

## 2012-02-12 DIAGNOSIS — J309 Allergic rhinitis, unspecified: Secondary | ICD-10-CM

## 2012-02-12 DIAGNOSIS — Z8709 Personal history of other diseases of the respiratory system: Secondary | ICD-10-CM

## 2012-02-12 DIAGNOSIS — Z0000 Encounter for general adult medical examination without abnormal findings: Secondary | ICD-10-CM

## 2012-02-12 DIAGNOSIS — N401 Enlarged prostate with lower urinary tract symptoms: Secondary | ICD-10-CM

## 2012-02-12 DIAGNOSIS — Z23 Encounter for immunization: Secondary | ICD-10-CM

## 2012-02-12 DIAGNOSIS — N138 Other obstructive and reflux uropathy: Secondary | ICD-10-CM

## 2012-02-12 DIAGNOSIS — I10 Essential (primary) hypertension: Secondary | ICD-10-CM

## 2012-02-12 DIAGNOSIS — N139 Obstructive and reflux uropathy, unspecified: Secondary | ICD-10-CM

## 2012-02-12 LAB — POCT URINALYSIS DIPSTICK
Bilirubin, UA: NEGATIVE
Glucose, UA: NEGATIVE
Leukocytes, UA: NEGATIVE
Nitrite, UA: NEGATIVE
Urobilinogen, UA: 0.2

## 2012-02-12 LAB — BASIC METABOLIC PANEL
Calcium: 9.2 mg/dL (ref 8.4–10.5)
GFR: 79.42 mL/min (ref >60.00)
Glucose, Bld: 104 mg/dL — ABNORMAL HIGH (ref 70–99)
Potassium: 4.3 mEq/L (ref 3.5–5.1)
Sodium: 136 mEq/L (ref 135–145)

## 2012-02-12 LAB — CBC WITH DIFFERENTIAL/PLATELET
Basophils Absolute: 0 10*3/uL (ref 0.0–0.1)
Eosinophils Absolute: 0.1 10*3/uL (ref 0.0–0.7)
Hemoglobin: 15 g/dL (ref 13.0–17.0)
Lymphocytes Relative: 23.2 % (ref 12.0–46.0)
MCHC: 33.8 g/dL (ref 30.0–36.0)
Monocytes Relative: 9.1 % (ref 3.0–12.0)
Neutro Abs: 3.9 10*3/uL (ref 1.4–7.7)
Neutrophils Relative %: 66.2 % (ref 43.0–77.0)
Platelets: 225 10*3/uL (ref 150.0–400.0)
RDW: 12.3 % (ref 11.5–14.6)

## 2012-02-12 LAB — HEPATIC FUNCTION PANEL
ALT: 21 U/L (ref 0–53)
AST: 23 U/L (ref 0–37)
Albumin: 4.2 g/dL (ref 3.5–5.2)
Alkaline Phosphatase: 63 U/L (ref 39–117)
Total Protein: 6.7 g/dL (ref 6.0–8.3)

## 2012-02-12 LAB — LIPID PANEL
Cholesterol: 235 mg/dL — ABNORMAL HIGH (ref 0–200)
Total CHOL/HDL Ratio: 6
Triglycerides: 74 mg/dL (ref 0.0–149.0)

## 2012-02-12 LAB — PSA: PSA: 0.86 ng/mL (ref 0.10–4.00)

## 2012-02-12 LAB — LDL CHOLESTEROL, DIRECT: Direct LDL: 188.9 mg/dL

## 2012-02-12 MED ORDER — FLUTICASONE PROPIONATE HFA 110 MCG/ACT IN AERO: 1.0000 | INHALATION_SPRAY | Freq: Two times a day (BID) | RESPIRATORY_TRACT | Status: DC

## 2012-02-12 MED ORDER — LISINOPRIL 40 MG PO TABS: ORAL_TABLET | ORAL | Status: DC

## 2012-02-12 MED ORDER — PREDNISONE 20 MG PO TABS: ORAL_TABLET | ORAL | Status: DC

## 2012-02-12 NOTE — Progress Notes (Signed)
Subjective:    Patient ID: Chad Yoder., male    DOB: Apr 24, 1945, 66 y.o.   MRN: [id number]  HPI Frazier is a 66 year old married male nonsmoker who comes in today for a Medicare wellness examination because of a history of underlying allergic rhinitis, occasional asthma, and hypertension  His hypertension is treated with lisinopril 40 mg daily BP 140/80  He takes over-the-counter Allegra for allergic rhinitis in about 5 weeks ago he started back on his Flovent. He's been taking 2 puffs twice daily because of his cough and wheezing. It was triggered by cleaning out an old barn.  He gets routine eye care, dental care, colonoscopy 6 years ago was normal however he said he had side effects of the procedure including fever and he had difficulty waking up from the anesthesia. He also gets his urology exams by his urologist. He declined a rectal exam  Cognitive function normal he walks on a regular basis home health safety reviewed no issues identified, no guns in the house, he does have a health care power of attorney and living will.   Review of Systems  Constitutional: Negative.   HENT: Negative.   Eyes: Negative.   Respiratory: Negative.   Cardiovascular: Negative.   Gastrointestinal: Negative.   Genitourinary: Negative.   Musculoskeletal: Negative.   Skin: Negative.   Neurological: Negative.   Hematological: Negative.   Psychiatric/Behavioral: Negative.        Objective:   Physical Exam  Constitutional: He is oriented to person, place, and time. He appears well-developed and well-nourished.  HENT:  Head: Normocephalic and atraumatic.  Right Ear: External ear normal.  Left Ear: External ear normal.  Nose: Nose normal.  Mouth/Throat: Oropharynx is clear and moist.  Eyes: Conjunctivae normal and EOM are normal. Pupils are equal, round, and reactive to light.  Neck: Normal range of motion. Neck supple. No JVD present. No tracheal deviation present. No thyromegaly present.   Cardiovascular: Normal rate, regular rhythm, normal heart sounds and intact distal pulses.  Exam reveals no gallop and no friction rub.   No murmur heard. Pulmonary/Chest: Effort normal and breath sounds normal. No stridor. No respiratory distress. He has no wheezes. He has no rales. He exhibits no tenderness.  Abdominal: Soft. Bowel sounds are normal. He exhibits no distension and no mass. There is no tenderness. There is no rebound and no guarding.  Genitourinary: Rectum normal, prostate normal and penis normal. Guaiac negative stool. No penile tenderness.  Musculoskeletal: Normal range of motion. He exhibits no edema and no tenderness.  Lymphadenopathy:    He has no cervical adenopathy.  Neurological: He is alert and oriented to person, place, and time. He has normal reflexes. No cranial nerve deficit. He exhibits normal muscle tone.  Skin: Skin is warm and dry. No rash noted. No erythema. No pallor.       Total body skin exam normal except for a nonhealing repeat lesion on the right side of his face advised to have that removed he prefers to go to the dermatologist. Also a couple actinic keratoses on his arms one on the left arm which is inflamed again advised that should be removed also if it does not heal  Psychiatric: He has a normal mood and affect. His behavior is normal. Judgment and thought content normal.          Assessment & Plan:  Healthy male  Allergic rhinitis continue Allegra  Flareup of asthma unresponsive to the Flovent inhaler switched to a short  course of prednisone  Hypertension continue lisinopril 40 mg daily

## 2012-02-12 NOTE — Patient Instructions (Signed)
Stop the antihistamine in the inhaler  Take a short course of prednisone as outlined  When you are finished with the prednisone that I would use one-shot of the Flovent twice daily for 2 months  Return in one year for general physical examination sooner if any problems  I would recommend the lesion on the right side he her face be removed also the one on your left arm be removed if it does not heal

## 2012-02-13 ENCOUNTER — Other Ambulatory Visit: Payer: Self-pay | Admitting: *Deleted

## 2012-02-13 DIAGNOSIS — Z8709 Personal history of other diseases of the respiratory system: Secondary | ICD-10-CM

## 2012-02-13 MED ORDER — PREDNISONE 20 MG PO TABS: ORAL_TABLET | ORAL | Status: DC

## 2012-03-23 ENCOUNTER — Other Ambulatory Visit: Payer: Self-pay | Admitting: Dermatology

## 2012-04-06 ENCOUNTER — Telehealth: Payer: Self-pay | Admitting: Family Medicine

## 2012-04-06 NOTE — Telephone Encounter (Signed)
Left message on voicemail. Need to know if needs labs results also or just exam?

## 2012-04-06 NOTE — Telephone Encounter (Signed)
Pt would like copy of exam (02/12/12) results mailed to him. Thank you.  Needs documentation for a secondary private insurance policy.

## 2012-04-07 NOTE — Telephone Encounter (Signed)
Fleet Contras please call Onalee Hua...... Okay to male report,,,,,,,, or he can look it up online

## 2012-07-14 ENCOUNTER — Encounter: Payer: Self-pay | Admitting: Internal Medicine

## 2013-01-14 ENCOUNTER — Encounter: Payer: Self-pay | Admitting: Internal Medicine

## 2013-03-25 ENCOUNTER — Other Ambulatory Visit: Payer: Self-pay | Admitting: Dermatology

## 2013-03-25 ENCOUNTER — Encounter: Payer: Medicare Other | Admitting: Family Medicine

## 2013-05-19 ENCOUNTER — Encounter: Payer: Medicare Other | Admitting: Family Medicine

## 2013-05-20 ENCOUNTER — Ambulatory Visit (INDEPENDENT_AMBULATORY_CARE_PROVIDER_SITE_OTHER): Payer: Medicare Other | Admitting: Family Medicine

## 2013-05-20 ENCOUNTER — Encounter: Payer: Self-pay | Admitting: Family Medicine

## 2013-05-20 ENCOUNTER — Telehealth: Payer: Self-pay | Admitting: Family Medicine

## 2013-05-20 VITALS — BP 160/80 | Temp 98.4°F | Ht 69.5 in | Wt 194.0 lb

## 2013-05-20 DIAGNOSIS — I10 Essential (primary) hypertension: Secondary | ICD-10-CM

## 2013-05-20 DIAGNOSIS — T7840XA Allergy, unspecified, initial encounter: Secondary | ICD-10-CM

## 2013-05-20 DIAGNOSIS — L0591 Pilonidal cyst without abscess: Secondary | ICD-10-CM

## 2013-05-20 LAB — CBC WITH DIFFERENTIAL/PLATELET
BASOS PCT: 0.4 % (ref 0.0–3.0)
Basophils Absolute: 0 10*3/uL (ref 0.0–0.1)
Eosinophils Absolute: 0.1 10*3/uL (ref 0.0–0.7)
Eosinophils Relative: 1.1 % (ref 0.0–5.0)
HCT: 44.3 % (ref 39.0–52.0)
HEMOGLOBIN: 15.1 g/dL (ref 13.0–17.0)
Lymphocytes Relative: 21 % (ref 12.0–46.0)
Lymphs Abs: 1.5 10*3/uL (ref 0.7–4.0)
MCHC: 34 g/dL (ref 30.0–36.0)
MCV: 87.7 fl (ref 78.0–100.0)
MONO ABS: 0.6 10*3/uL (ref 0.1–1.0)
Monocytes Relative: 8.4 % (ref 3.0–12.0)
NEUTROS ABS: 4.9 10*3/uL (ref 1.4–7.7)
Neutrophils Relative %: 69.1 % (ref 43.0–77.0)
Platelets: 256 10*3/uL (ref 150.0–400.0)
RBC: 5.05 Mil/uL (ref 4.22–5.81)
RDW: 13.5 % (ref 11.5–14.6)
WBC: 7 10*3/uL (ref 4.5–10.5)

## 2013-05-20 LAB — BASIC METABOLIC PANEL
BUN: 17 mg/dL (ref 6–23)
CALCIUM: 9.3 mg/dL (ref 8.4–10.5)
CO2: 27 meq/L (ref 19–32)
CREATININE: 1.1 mg/dL (ref 0.4–1.5)
Chloride: 104 mEq/L (ref 96–112)
GFR: 70.14 mL/min (ref >60.00)
Glucose, Bld: 97 mg/dL (ref 70–99)
Potassium: 4.3 mEq/L (ref 3.5–5.1)
Sodium: 136 mEq/L (ref 135–145)

## 2013-05-20 LAB — POCT URINALYSIS DIPSTICK
Bilirubin, UA: NEGATIVE
GLUCOSE UA: NEGATIVE
Ketones, UA: NEGATIVE
Leukocytes, UA: NEGATIVE
NITRITE UA: NEGATIVE
PH UA: 6
PROTEIN UA: NEGATIVE
RBC UA: NEGATIVE
SPEC GRAV UA: 1.02
Urobilinogen, UA: 0.2

## 2013-05-20 LAB — HEPATIC FUNCTION PANEL
ALT: 22 U/L (ref 0–53)
AST: 21 U/L (ref 0–37)
Albumin: 4.3 g/dL (ref 3.5–5.2)
Alkaline Phosphatase: 64 U/L (ref 39–117)
BILIRUBIN DIRECT: 0 mg/dL (ref 0.0–0.3)
BILIRUBIN TOTAL: 1.1 mg/dL (ref 0.3–1.2)
Total Protein: 7.2 g/dL (ref 6.0–8.3)

## 2013-05-20 LAB — LIPID PANEL
CHOLESTEROL: 242 mg/dL — AB (ref 0–200)
HDL: 40.7 mg/dL (ref >39.00)
LDL CALC: 182 mg/dL — AB (ref 0–99)
Total CHOL/HDL Ratio: 6
Triglycerides: 99 mg/dL (ref 0.0–149.0)
VLDL: 19.8 mg/dL (ref 0.0–40.0)

## 2013-05-20 LAB — TSH: TSH: 0.45 u[IU]/mL (ref 0.35–5.50)

## 2013-05-20 MED ORDER — LISINOPRIL 40 MG PO TABS: ORAL_TABLET | ORAL | Status: DC

## 2013-05-20 MED ORDER — TRIAMCINOLONE ACETONIDE 55 MCG/ACT NA AERO: 2.0000 | INHALATION_SPRAY | Freq: Every day | NASAL | Status: DC

## 2013-05-20 MED ORDER — MONTELUKAST SODIUM 10 MG PO TABS: 10.0000 mg | ORAL_TABLET | Freq: Every day | ORAL | Status: DC

## 2013-05-20 MED ORDER — NYSTATIN-TRIAMCINOLONE 100000-0.1 UNIT/GM-% EX OINT: 1.0000 "application " | TOPICAL_OINTMENT | Freq: Two times a day (BID) | CUTANEOUS | Status: DC

## 2013-05-20 MED ORDER — ALBUTEROL SULFATE HFA 108 (90 BASE) MCG/ACT IN AERS: 2.0000 | INHALATION_SPRAY | Freq: Four times a day (QID) | RESPIRATORY_TRACT | Status: DC | PRN

## 2013-05-20 MED ORDER — CEPHALEXIN 500 MG PO CAPS: ORAL_CAPSULE | ORAL | Status: DC

## 2013-05-20 NOTE — Telephone Encounter (Signed)
Pt is requesting ointment for his planters wart, states that he was instructed to use it because it "kills the virus that forms the wart" and states that he is aware to use a blow dryer to cure the ointment. Also requesting refill of nystatin-triamcinolone (MYCOLOG) ointment, triamcinolone (NASACORT ALLERGY 24HR) 55 MCG/ACT AERO nasal inhaler and an ointment called actinate (not sure about spelling) and this may be the ointment for the wart, pt was a little confused.  Please call in refills to [facility] on [address]. Thanks

## 2013-05-20 NOTE — Progress Notes (Signed)
Pre visit review using our clinic review tool, if applicable. No additional management support is needed unless otherwise documented below in the visit note. 

## 2013-05-20 NOTE — Progress Notes (Signed)
   Subjective:    Patient ID: Chad Novemberavid A Oncale Sr., male    DOB: 05-08-1945, 68 y.o.   MRN: [id number]  HPI Chad Yoder is a 68 year old male still working nonsmoker married who comes in today for a Medicare wellness examination  Has a history of hypertension takes lisinopril 40 mg daily BP at home 140/76  He uses steroid nasal spray for allergic rhinitis. 6 we discussed adding an antihistamine and Singulair  6 he takes Keflex with some and travels because she's had a history of a perirectal abscess.  He gets routine eye care, dental care, 6 last colonoscopy normal except for one small polyp however he is reluctant to have a followup colonoscopy because post colonoscopy of the fever for 4 days is  Cognitive function normal he still works and walks on a regular basis home health safety reviewed no issues identified, no guns in the house, stayed does have a health care power of attorney and living well    Review of Systems  Constitutional: Negative.   HENT: Negative.   Eyes: Negative.   Respiratory: Negative.   Cardiovascular: Negative.   Gastrointestinal: Negative.   Genitourinary: Negative.   Musculoskeletal: Negative.   Skin: Negative.   Neurological: Negative.   Psychiatric/Behavioral: Negative.        Objective:   Physical Exam  Nursing note and vitals reviewed. Constitutional: He is oriented to person, place, and time. He appears well-developed and well-nourished.  HENT:  Head: Normocephalic and atraumatic.  Right Ear: External ear normal.  Left Ear: External ear normal.  Nose: Nose normal.  Mouth/Throat: Oropharynx is clear and moist.  Eyes: Conjunctivae and EOM are normal. Pupils are equal, round, and reactive to light.  Neck: Normal range of motion. Neck supple. No JVD present. No tracheal deviation present. No thyromegaly present.  Cardiovascular: Normal rate, regular rhythm, normal heart sounds and intact distal pulses.  Exam reveals no gallop and no friction rub.   No  murmur heard. No carotid or bruits peripheral pulses 2+ and symmetrical  Pulmonary/Chest: Effort normal and breath sounds normal. No stridor. No respiratory distress. He has no wheezes. He has no rales. He exhibits no tenderness.  Abdominal: Soft. Bowel sounds are normal. He exhibits no distension and no mass. There is no tenderness. There is no rebound and no guarding.  Genitourinary: Rectum normal, prostate normal and penis normal. Guaiac negative stool. No penile tenderness.  Musculoskeletal: Normal range of motion. He exhibits no edema and no tenderness.  Lymphadenopathy:    He has no cervical adenopathy.  Neurological: He is alert and oriented to person, place, and time. He has normal reflexes. No cranial nerve deficit. He exhibits normal muscle tone.  Skin: Skin is warm and dry. No rash noted. No erythema. No pallor.  Psychiatric: He has a normal mood and affect. His behavior is normal. Judgment and thought content normal.          Assessment & Plan:  Healthy male  Allergic rhinitis,,,,,, Claritin,,,,,,,,, steroid nasal spray,,,, add Singulair  History of perirectal abscess........Marland Kitchen. 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

## 2013-05-20 NOTE — Telephone Encounter (Signed)
Nystatin ointment sent to pharmacy, Duofilm OTC per Dr Tawanna Coolerodd for wart, patient does not need Nasacort at this time

## 2013-05-20 NOTE — Patient Instructions (Addendum)
Plain 10 mg Claritin,,,,,,, +1 shot of steroid nasal spray up each nostril at bedtime  If the above does not work add Singulair 10 mg........ one tablet daily at bedtime  Continue your lisinopril 40 mg,,,,,,,,, 1 daily  Albuterol inhaler,,,,,,,,,, 1 puff 3 times daily. For wheezing  Refill Keflex to take when you go on a trip  Eye physician,,,,,,,, Dr. Sol Blazingon Digby

## 2013-05-21 ENCOUNTER — Telehealth: Payer: Self-pay | Admitting: Family Medicine

## 2013-05-21 NOTE — Telephone Encounter (Signed)
Relevant patient education mailed to patient.  

## 2013-09-14 ENCOUNTER — Other Ambulatory Visit: Payer: Self-pay | Admitting: Dermatology

## 2013-10-12 ENCOUNTER — Other Ambulatory Visit: Payer: Self-pay | Admitting: Family Medicine

## 2014-01-06 ENCOUNTER — Ambulatory Visit (INDEPENDENT_AMBULATORY_CARE_PROVIDER_SITE_OTHER): Payer: Medicare Other | Admitting: Family Medicine

## 2014-01-06 VITALS — BP 160/90 | HR 88 | Temp 98.2°F | Wt 190.0 lb

## 2014-01-06 DIAGNOSIS — Z8709 Personal history of other diseases of the respiratory system: Secondary | ICD-10-CM

## 2014-01-06 MED ORDER — PREDNISONE 20 MG PO TABS: ORAL_TABLET | ORAL | Status: DC

## 2014-01-06 NOTE — Progress Notes (Signed)
   Subjective:    Patient ID: Chad Novemberavid A Lokey Sr., male    DOB: 1946/02/23, 68 y.o.   MRN: [id number]  HPI Chad Yoder is a 68 year old married male nonsmoker who comes in today for evaluation of flare of his asthma  Last Friday he woke up and he began to wheeze. He took 3 prednisone tablets daily for 3 days of a 68-year-old prescription and didn't seem to help all that much so he is in here today for evaluation  No fever   Review of Systems Review of systems otherwise negative    Objective:   Physical Exam Well-developed well-nourished male no acute distress vital signs stable he is afebrile HEENT were negative neck was supple no adenopathy lungs are clear except for some mild expiratory wheezing on forced expiration       Assessment & Plan:  ////////// mild asthma//refill prednisone burst and taper

## 2014-01-06 NOTE — Progress Notes (Signed)
Pre visit review using our clinic review tool, if applicable. No additional management support is needed unless otherwise documented below in the visit note. 

## 2014-01-06 NOTE — Patient Instructions (Signed)
Prednisone 20 mg............... Take 2 tablets now and starting 2 tabs for 3 days............ Then taper slowly as outlined  Return when necessary

## 2014-06-28 ENCOUNTER — Other Ambulatory Visit (INDEPENDENT_AMBULATORY_CARE_PROVIDER_SITE_OTHER): Payer: Medicare Other

## 2014-06-28 DIAGNOSIS — I10 Essential (primary) hypertension: Secondary | ICD-10-CM

## 2014-06-28 DIAGNOSIS — Z125 Encounter for screening for malignant neoplasm of prostate: Secondary | ICD-10-CM | POA: Diagnosis not present

## 2014-06-28 DIAGNOSIS — Z0000 Encounter for general adult medical examination without abnormal findings: Secondary | ICD-10-CM | POA: Diagnosis not present

## 2014-06-28 DIAGNOSIS — Z79899 Other long term (current) drug therapy: Secondary | ICD-10-CM

## 2014-06-28 LAB — PSA: PSA: 1.07 ng/mL (ref 0.10–4.00)

## 2014-06-28 LAB — LIPID PANEL
CHOLESTEROL: 205 mg/dL — AB (ref 0–200)
HDL: 38.2 mg/dL — ABNORMAL LOW (ref >39.00)
LDL CALC: 150 mg/dL — AB (ref 0–99)
NonHDL: 166.8
Total CHOL/HDL Ratio: 5
Triglycerides: 82 mg/dL (ref 0.0–149.0)
VLDL: 16.4 mg/dL (ref 0.0–40.0)

## 2014-06-28 LAB — HEPATIC FUNCTION PANEL
ALK PHOS: 69 U/L (ref 39–117)
ALT: 19 U/L (ref 0–53)
AST: 19 U/L (ref 0–37)
Albumin: 4.3 g/dL (ref 3.5–5.2)
Bilirubin, Direct: 0.1 mg/dL (ref 0.0–0.3)
TOTAL PROTEIN: 6.7 g/dL (ref 6.0–8.3)
Total Bilirubin: 0.8 mg/dL (ref 0.2–1.2)

## 2014-06-28 LAB — CBC WITH DIFFERENTIAL/PLATELET
Basophils Absolute: 0 10*3/uL (ref 0.0–0.1)
Basophils Relative: 0.3 % (ref 0.0–3.0)
Eosinophils Absolute: 0.2 10*3/uL (ref 0.0–0.7)
Eosinophils Relative: 2.6 % (ref 0.0–5.0)
HEMATOCRIT: 43.9 % (ref 39.0–52.0)
HEMOGLOBIN: 15.2 g/dL (ref 13.0–17.0)
LYMPHS PCT: 26.9 % (ref 12.0–46.0)
Lymphs Abs: 1.9 10*3/uL (ref 0.7–4.0)
MCHC: 34.7 g/dL (ref 30.0–36.0)
MCV: 85.5 fl (ref 78.0–100.0)
MONOS PCT: 9.1 % (ref 3.0–12.0)
Monocytes Absolute: 0.7 10*3/uL (ref 0.1–1.0)
NEUTROS ABS: 4.4 10*3/uL (ref 1.4–7.7)
Neutrophils Relative %: 61.1 % (ref 43.0–77.0)
PLATELETS: 244 10*3/uL (ref 150.0–400.0)
RBC: 5.14 Mil/uL (ref 4.22–5.81)
RDW: 13.2 % (ref 11.5–15.5)
WBC: 7.2 10*3/uL (ref 4.0–10.5)

## 2014-06-28 LAB — BASIC METABOLIC PANEL
BUN: 15 mg/dL (ref 6–23)
CO2: 28 mEq/L (ref 19–32)
Calcium: 9.5 mg/dL (ref 8.4–10.5)
Chloride: 105 mEq/L (ref 96–112)
Creatinine, Ser: 1.08 mg/dL (ref 0.40–1.50)
GFR: 72.15 mL/min (ref >60.00)
Glucose, Bld: 106 mg/dL — ABNORMAL HIGH (ref 70–99)
POTASSIUM: 4.7 meq/L (ref 3.5–5.1)
Sodium: 138 mEq/L (ref 135–145)

## 2014-06-28 LAB — TSH: TSH: 1.19 u[IU]/mL (ref 0.35–4.50)

## 2014-06-28 LAB — POCT URINALYSIS DIPSTICK
Bilirubin, UA: NEGATIVE
Glucose, UA: NEGATIVE
KETONES UA: NEGATIVE
Leukocytes, UA: NEGATIVE
Nitrite, UA: NEGATIVE
PROTEIN UA: NEGATIVE
RBC UA: NEGATIVE
SPEC GRAV UA: 1.025
UROBILINOGEN UA: 0.2
pH, UA: 5.5

## 2014-07-05 ENCOUNTER — Encounter: Payer: Self-pay | Admitting: Adult Health

## 2014-07-05 ENCOUNTER — Encounter: Payer: Self-pay | Admitting: Family Medicine

## 2014-07-05 ENCOUNTER — Ambulatory Visit (INDEPENDENT_AMBULATORY_CARE_PROVIDER_SITE_OTHER): Payer: Medicare Other | Admitting: Adult Health

## 2014-07-05 VITALS — BP 144/74 | Temp 98.2°F | Ht 69.5 in | Wt 195.7 lb

## 2014-07-05 DIAGNOSIS — I10 Essential (primary) hypertension: Secondary | ICD-10-CM

## 2014-07-05 DIAGNOSIS — B369 Superficial mycosis, unspecified: Secondary | ICD-10-CM | POA: Diagnosis not present

## 2014-07-05 DIAGNOSIS — Z0000 Encounter for general adult medical examination without abnormal findings: Secondary | ICD-10-CM

## 2014-07-05 MED ORDER — CEPHALEXIN 500 MG PO CAPS: 500.0000 mg | ORAL_CAPSULE | Freq: Two times a day (BID) | ORAL | Status: DC

## 2014-07-05 MED ORDER — NYSTATIN-TRIAMCINOLONE 100000-0.1 UNIT/GM-% EX OINT: 1.0000 "application " | TOPICAL_OINTMENT | Freq: Two times a day (BID) | CUTANEOUS | Status: DC

## 2014-07-05 MED ORDER — LISINOPRIL 40 MG PO TABS: ORAL_TABLET | ORAL | Status: DC

## 2014-07-05 MED ORDER — PREDNISONE 20 MG PO TABS: ORAL_TABLET | ORAL | Status: DC

## 2014-07-05 MED ORDER — TRIAMCINOLONE ACETONIDE 0.1 % EX CREA: TOPICAL_CREAM | Freq: Two times a day (BID) | CUTANEOUS | Status: DC

## 2014-07-05 NOTE — Patient Instructions (Addendum)
As discussed your labs look good,except for your cholesterol that is slightly elevated. Continue to work on diet and exercise. All your prescriptions will be sent in today. You can follow up in a year for your next physical. Follow up sooner if needed. It was great meeting you today!  [name] , Thank you for taking time to come for your Medicare Wellness Visit. I appreciate your ongoing commitment to your health goals. Please review the following plan we discussed and let me know if I can assist you in the future.     This is a list of the screening recommended for you and due dates:  Health Maintenance  Topic Date Due  . Shingles Vaccine  12/14/2005  . Pneumonia vaccines (2 of 2 - PCV13) 12/27/2011  . Flu Shot  09/26/2014  . Colon Cancer Screening  08/18/2017  . Tetanus Vaccine  06/20/2019     Health Maintenance A healthy lifestyle and preventative care can promote health and wellness.  Maintain regular health, dental, and eye exams.  Eat a healthy diet. Foods like vegetables, fruits, whole grains, low-fat dairy products, and lean protein foods contain the nutrients you need and are low in calories. Decrease your intake of foods high in solid fats, added sugars, and salt. Get information about a proper diet from your health care provider, if necessary.  Regular physical exercise is one of the most important things you can do for your health. Most adults should get at least 150 minutes of moderate-intensity exercise (any activity that increases your heart rate and causes you to sweat) each week. In addition, most adults need muscle-strengthening exercises on 2 or more days a week.   Maintain a healthy weight. The body mass index (BMI) is a screening tool to identify possible weight problems. It provides an estimate of body fat based on height and weight. Your health care provider can find your BMI and can help you achieve or maintain a healthy weight. For males 20 years and older:  A  BMI below 18.5 is considered underweight.  A BMI of 18.5 to 24.9 is normal.  A BMI of 25 to 29.9 is considered overweight.  A BMI of 30 and above is considered obese.  Maintain normal blood lipids and cholesterol by exercising and minimizing your intake of saturated fat. Eat a balanced diet with plenty of fruits and vegetables. Blood tests for lipids and cholesterol should begin at age 69 and be repeated every 5 years. If your lipid or cholesterol levels are high, you are over age 69, or you are at high risk for heart disease, you may need your cholesterol levels checked more frequently.Ongoing high lipid and cholesterol levels should be treated with medicines if diet and exercise are not working.  If you smoke, find out from your health care provider how to quit. If you do not use tobacco, do not start.  Lung cancer screening is recommended for adults aged 55-80 years who are at high risk for developing lung cancer because of a history of smoking. A yearly low-dose CT scan of the lungs is recommended for people who have at least a 30-pack-year history of smoking and are current smokers or have quit within the past 15 years. A pack year of smoking is smoking an average of 1 pack of cigarettes a day for 1 year (for example, a 30-pack-year history of smoking could mean smoking 1 pack a day for 30 years or 2 packs a day for 15 years). Yearly  screening should continue until the smoker has stopped smoking for at least 15 years. Yearly screening should be stopped for people who develop a health problem that would prevent them from having lung cancer treatment.  If you choose to drink alcohol, do not have more than 2 drinks per day. One drink is considered to be 12 oz (360 mL) of beer, 5 oz (150 mL) of wine, or 1.5 oz (45 mL) of liquor.  Avoid the use of street drugs. Do not share needles with anyone. Ask for help if you need support or instructions about stopping the use of drugs.  High blood pressure  causes heart disease and increases the risk of stroke. Blood pressure should be checked at least every 1-2 years. Ongoing high blood pressure should be treated with medicines if weight loss and exercise are not effective.  If you are 47-39 years old, ask your health care provider if you should take aspirin to prevent heart disease.  Diabetes screening involves taking a blood sample to check your fasting blood sugar level. This should be done once every 3 years after age 66 if you are at a normal weight and without risk factors for diabetes. Testing should be considered at a younger age or be carried out more frequently if you are overweight and have at least 1 risk factor for diabetes.  Colorectal cancer can be detected and often prevented. Most routine colorectal cancer screening begins at the age of 65 and continues through age 53. However, your health care provider may recommend screening at an earlier age if you have risk factors for colon cancer. On a yearly basis, your health care provider may provide home test kits to check for hidden blood in the stool. A small camera at the end of a tube may be used to directly examine the colon (sigmoidoscopy or colonoscopy) to detect the earliest forms of colorectal cancer. Talk to your health care provider about this at age 66 when routine screening begins. A direct exam of the colon should be repeated every 5-10 years through age 19, unless early forms of precancerous polyps or small growths are found.  People who are at an increased risk for hepatitis B should be screened for this virus. You are considered at high risk for hepatitis B if:  You were born in a country where hepatitis B occurs often. Talk with your health care provider about which countries are considered high risk.  Your parents were born in a high-risk country and you have not received a shot to protect against hepatitis B (hepatitis B vaccine).  You have HIV or AIDS.  You use needles to  inject street drugs.  You live with, or have sex with, someone who has hepatitis B.  You are a man who has sex with other men (MSM).  You get hemodialysis treatment.  You take certain medicines for conditions like cancer, organ transplantation, and autoimmune conditions.  Hepatitis C blood testing is recommended for all people born from [birth year] through 1965 and any individual with known risk factors for hepatitis C.  Healthy men should no longer receive prostate-specific antigen (PSA) blood tests as part of routine cancer screening. Talk to your health care provider about prostate cancer screening.  Testicular cancer screening is not recommended for adolescents or adult males who have no symptoms. Screening includes self-exam, a health care provider exam, and other screening tests. Consult with your health care provider about any symptoms you have or any concerns you have about  testicular cancer.  Practice safe sex. Use condoms and avoid high-risk sexual practices to reduce the spread of sexually transmitted infections (STIs).  You should be screened for STIs, including gonorrhea and chlamydia if:  You are sexually active and are younger than 24 years.  You are older than 24 years, and your health care provider tells you that you are at risk for this type of infection.  Your sexual activity has changed since you were last screened, and you are at an increased risk for chlamydia or gonorrhea. Ask your health care provider if you are at risk.  If you are at risk of being infected with HIV, it is recommended that you take a prescription medicine daily to prevent HIV infection. This is called pre-exposure prophylaxis (PrEP). You are considered at risk if:  You are a man who has sex with other men (MSM).  You are a heterosexual man who is sexually active with multiple partners.  You take drugs by injection.  You are sexually active with a partner who has HIV.  Talk with your health care  provider about whether you are at high risk of being infected with HIV. If you choose to begin PrEP, you should first be tested for HIV. You should then be tested every 3 months for as long as you are taking PrEP.  Use sunscreen. Apply sunscreen liberally and repeatedly throughout the day. You should seek shade when your shadow is shorter than you. Protect yourself by wearing long sleeves, pants, a wide-brimmed hat, and sunglasses year round whenever you are outdoors.  Tell your health care provider of new moles or changes in moles, especially if there is a change in shape or color. Also, tell your health care provider if a mole is larger than the size of a pencil eraser.  A one-time screening for abdominal aortic aneurysm (AAA) and surgical repair of large AAAs by ultrasound is recommended for men aged 65-75 years who are current or former smokers.  Stay current with your vaccines (immunizations). Document Released: 08/10/2007 Document Revised: 02/16/2013 Document Reviewed: 07/09/2010 [facility] Patient Information 2015 [place] [place]. This information is not intended to replace advice given to you by your health care provider. Make sure you discuss any questions you have with your health care provider.

## 2014-07-05 NOTE — Progress Notes (Signed)
Subjective:    Patient ID: Chad Yoder., male    DOB: June 08, 1945, 69 y.o.   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 He does not smoke and has a history of HTN, Asthma, Hx of colon cancer. He presents to the office today for his medicare wellness exam.   He has not had any recent hospitalizations  He takes Lisinopril 40 mg for HTN. Reports well controlled at home.   Continues to have rhinnorha- especially when outside.  He gets routine eye exams and dental care.   He likes to have a prescription for Keflex and Prednisone on hand, due to his history of asthma and perirectal abscess.   Asthma and allergies are well controlled this season.   Continues to travel often.   Review of Systems  Constitutional: Negative for fever, activity change, appetite change, fatigue and unexpected weight change.  HENT: Positive for congestion, postnasal drip and rhinorrhea. Negative for ear discharge, ear pain, sinus pressure, sneezing, sore throat, tinnitus, trouble swallowing and voice change.   Eyes: Negative for pain and itching.  Cardiovascular: Negative for chest pain, palpitations and leg swelling.  Allergic/Immunologic: Positive for environmental allergies. Negative for food allergies and immunocompromised state.  All other systems reviewed and are negative.      Objective:   Physical Exam  Constitutional: He is oriented to person, place, and time. He appears well-developed and well-nourished. No distress.  HENT:  Head: Normocephalic and atraumatic.  Right Ear: External ear normal.  Left Ear: External ear normal.  Nose: Nose normal.  Mouth/Throat: Oropharynx is clear and moist. No oropharyngeal exudate.  Eyes: Conjunctivae and EOM are normal. Pupils are equal, round, and reactive to light. Right eye exhibits no discharge. Left eye exhibits no discharge.  Neck: Normal  range of motion. Neck supple. No JVD present. No tracheal deviation present. No thyromegaly present.  Cardiovascular: Normal rate, regular rhythm and intact distal pulses.  Exam reveals no gallop and no friction rub.   No murmur heard. No carotid bruit  Pulmonary/Chest: Effort normal and breath sounds normal. No respiratory distress. He has no wheezes. He has no rales. He exhibits no tenderness.  Abdominal: Soft. Bowel sounds are normal. He exhibits no distension. There is no tenderness. There is no rebound and no guarding.  Genitourinary:  Deferred - he will have his prostate checked by urology  Musculoskeletal: Normal range of motion. He exhibits no edema or tenderness.  Lymphadenopathy:    He has no cervical adenopathy.  Neurological: He is alert and oriented to person, place, and time. He has normal reflexes.  Skin: Skin is warm and dry. No rash noted. No erythema. No pallor.  Psychiatric: He has a normal mood and affect. His behavior is normal. Judgment and thought content normal.  Nursing note and vitals reviewed.      Assessment & Plan:  1. Essential hypertension - Continue to monitor blood pressure at home. Inform PCP of highs and lows.  - lisinopril (PRINIVIL,ZESTRIL) 40 MG tablet; 1 by mouth every morning  Dispense: 100 tablet; Refill: 3  2. Fungal dermatitis - nystatin-triamcinolone ointment (MYCOLOG); Apply 1 application topically 2 (two) times daily.  Dispense: 60 g; Refill: 3 - Triamcinolone cream ( Kenalog) 0.1%; 30 g, Refill:3 3. Medicare annual wellness visit, subsequent - Health Maintenance reviewed with patient. Including home safety issues, healthy diet and the  need to exercise.  - he has an health care power of attorney and living will.  - reviewed labs with patient and discussed his abnormal cholesterol and LDL

## 2014-07-05 NOTE — Addendum Note (Signed)
Addended by: Azucena FreedMILLNER, Jacque Byron C on: 07/05/2014 12:16 PM   Modules accepted: Orders

## 2014-07-05 NOTE — Progress Notes (Signed)
Pre visit review using our clinic review tool, if applicable. No additional management support is needed unless otherwise documented below in the visit note. 

## 2015-06-19 ENCOUNTER — Other Ambulatory Visit: Payer: Self-pay | Admitting: Adult Health

## 2015-06-20 NOTE — Telephone Encounter (Signed)
See below

## 2015-06-20 NOTE — Telephone Encounter (Signed)
Ok to refill 

## 2015-06-20 NOTE — Telephone Encounter (Signed)
Dr. Tawanna Coolerodd is still seeing this patient.

## 2015-10-17 ENCOUNTER — Other Ambulatory Visit: Payer: Self-pay | Admitting: Adult Health

## 2015-10-18 NOTE — Telephone Encounter (Signed)
Follow up with Dr. Tawanna Coolerodd about this

## 2015-10-18 NOTE — Telephone Encounter (Signed)
Last refilled 07/05/14 for #20 with 0 refills Last O/v was 07/05/14

## 2015-12-20 ENCOUNTER — Other Ambulatory Visit: Payer: Self-pay | Admitting: Family Medicine

## 2015-12-25 ENCOUNTER — Other Ambulatory Visit: Payer: Self-pay | Admitting: Family Medicine

## 2016-01-02 ENCOUNTER — Other Ambulatory Visit (INDEPENDENT_AMBULATORY_CARE_PROVIDER_SITE_OTHER): Payer: Medicare Other

## 2016-01-02 DIAGNOSIS — Z0000 Encounter for general adult medical examination without abnormal findings: Secondary | ICD-10-CM | POA: Diagnosis not present

## 2016-01-02 LAB — CBC WITH DIFFERENTIAL/PLATELET
BASOS ABS: 0 10*3/uL (ref 0.0–0.1)
Basophils Relative: 0.2 % (ref 0.0–3.0)
EOS PCT: 1.4 % (ref 0.0–5.0)
Eosinophils Absolute: 0.1 10*3/uL (ref 0.0–0.7)
HCT: 44.9 % (ref 39.0–52.0)
HEMOGLOBIN: 15.1 g/dL (ref 13.0–17.0)
LYMPHS PCT: 24.5 % (ref 12.0–46.0)
Lymphs Abs: 1.7 10*3/uL (ref 0.7–4.0)
MCHC: 33.7 g/dL (ref 30.0–36.0)
MCV: 86.9 fl (ref 78.0–100.0)
MONOS PCT: 9.1 % (ref 3.0–12.0)
Monocytes Absolute: 0.6 10*3/uL (ref 0.1–1.0)
NEUTROS PCT: 64.8 % (ref 43.0–77.0)
Neutro Abs: 4.6 10*3/uL (ref 1.4–7.7)
Platelets: 236 10*3/uL (ref 150.0–400.0)
RBC: 5.16 Mil/uL (ref 4.22–5.81)
RDW: 13.1 % (ref 11.5–15.5)
WBC: 7.1 10*3/uL (ref 4.0–10.5)

## 2016-01-02 LAB — HEPATIC FUNCTION PANEL
ALBUMIN: 4.5 g/dL (ref 3.5–5.2)
ALK PHOS: 66 U/L (ref 39–117)
ALT: 16 U/L (ref 0–53)
AST: 18 U/L (ref 0–37)
Bilirubin, Direct: 0.2 mg/dL (ref 0.0–0.3)
Total Bilirubin: 1.1 mg/dL (ref 0.2–1.2)
Total Protein: 6.6 g/dL (ref 6.0–8.3)

## 2016-01-02 LAB — BASIC METABOLIC PANEL
BUN: 17 mg/dL (ref 6–23)
CALCIUM: 9.5 mg/dL (ref 8.4–10.5)
CO2: 29 mEq/L (ref 19–32)
Chloride: 103 mEq/L (ref 96–112)
Creatinine, Ser: 1.04 mg/dL (ref 0.40–1.50)
GFR: 75.03 mL/min (ref >60.00)
Glucose, Bld: 90 mg/dL (ref 70–99)
POTASSIUM: 4.4 meq/L (ref 3.5–5.1)
SODIUM: 139 meq/L (ref 135–145)

## 2016-01-02 LAB — POC URINALSYSI DIPSTICK (AUTOMATED)
Bilirubin, UA: NEGATIVE
Glucose, UA: NEGATIVE
Leukocytes, UA: NEGATIVE
Nitrite, UA: NEGATIVE
PROTEIN UA: NEGATIVE
RBC UA: NEGATIVE
SPEC GRAV UA: 1.015
UROBILINOGEN UA: 0.2
pH, UA: 5.5

## 2016-01-02 LAB — LIPID PANEL
CHOLESTEROL: 205 mg/dL — AB (ref 0–200)
HDL: 45.7 mg/dL (ref >39.00)
LDL CALC: 146 mg/dL — AB (ref 0–99)
NONHDL: 159.04
Total CHOL/HDL Ratio: 4
Triglycerides: 65 mg/dL (ref 0.0–149.0)
VLDL: 13 mg/dL (ref 0.0–40.0)

## 2016-01-02 LAB — PSA: PSA: 1.33 ng/mL (ref 0.10–4.00)

## 2016-01-02 LAB — TSH: TSH: 1.05 u[IU]/mL (ref 0.35–4.50)

## 2016-01-09 ENCOUNTER — Ambulatory Visit (INDEPENDENT_AMBULATORY_CARE_PROVIDER_SITE_OTHER): Payer: Medicare Other | Admitting: Family Medicine

## 2016-01-09 ENCOUNTER — Encounter: Payer: Self-pay | Admitting: Family Medicine

## 2016-01-09 VITALS — BP 180/84 | HR 100 | Temp 97.6°F | Ht 68.0 in | Wt 177.7 lb

## 2016-01-09 DIAGNOSIS — I10 Essential (primary) hypertension: Secondary | ICD-10-CM

## 2016-01-09 DIAGNOSIS — Z8709 Personal history of other diseases of the respiratory system: Secondary | ICD-10-CM | POA: Diagnosis not present

## 2016-01-09 MED ORDER — MONTELUKAST SODIUM 10 MG PO TABS: 10.0000 mg | ORAL_TABLET | Freq: Every day | ORAL | 4 refills | Status: DC

## 2016-01-09 MED ORDER — PROAIR HFA 108 (90 BASE) MCG/ACT IN AERS: 2.0000 | INHALATION_SPRAY | Freq: Four times a day (QID) | RESPIRATORY_TRACT | 2 refills | Status: DC | PRN

## 2016-01-09 MED ORDER — LISINOPRIL 40 MG PO TABS: ORAL_TABLET | ORAL | 4 refills | Status: DC

## 2016-01-09 MED ORDER — PREDNISONE 20 MG PO TABS: ORAL_TABLET | ORAL | 1 refills | Status: DC

## 2016-01-09 MED ORDER — CEPHALEXIN 500 MG PO CAPS: 500.0000 mg | ORAL_CAPSULE | Freq: Two times a day (BID) | ORAL | 0 refills | Status: DC

## 2016-01-09 NOTE — Progress Notes (Signed)
Chad Yoder is a 70 year old married male nonsmoker who comes in today for physical evaluation because of a history of underlying allergic rhinitis, hypertension  His allergic rhinitis bothers him all your round. He's tried different antihistamines don't seem to help. He's tried a nasal steroid spray that doesn't help. He's never tried Singulair. He's been to an allergist in the past had allergy shots but didn't help either. Occasionally when he gets a bad cold have a flare of asthma. At that juncture he'll take a short course of oral prednisone and use the albuterol.  He gets routine eye care, dental care, colonoscopy 2012 was normal except for small polyp. His mother had colon cancer. He's disinclined to have any more colonoscopies. I advised him he should have a colonoscopy without family history.  [facility] for [facility] care and physical examination. I encouraged him [facility] and to stay with him since I'm going to be slowing down.  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 E sees isdraolgs ona rgula basis Koza history of sss  Prevnar 13 was given to update his vaccinations. He can have a flu shot is allergic to eggs.  Cognitive function normal he walks daily home health safety reviewed no issues identified, no guns in the house, he does have a healthcare power of attorney and living will  14 point review of systems are negative except for above  .BP (!) 180/84 (BP Location: Left Arm, Patient Position: Sitting, Cuff Size: Normal)   Pulse 100   Temp 97.6 F (36.4 C) (Oral)   Ht 5\' 8"  (1.727 m)   Wt 177 lb 11.2 oz (80.6 kg)   SpO2 96%   BMI 27.02 kg/m  Examination of the HEENT were negative neck was supple no adenopathy thyroid normal no carotid bruits cardiopulmonary exam normal abdominal exam normal genitalia....... he sees urologist and defers that to them  Extremities normal skin normal peripheral  pulses normal except for trace peripheral edema  EKG was done because a history of hypertension. Unchanged from previous cardiogram.  Impression #1 hypertension at goal............ continue current therapy  #2 allergic rhinitis...Marland Kitchen.Marland Kitchen.Marland Kitchen. trial of Singulair  #3 history of occasional asthma............ albuterol and prednisone short course when necessary  BPH........ followed by urologist  History of skin cancer....... followed by dermatology.

## 2016-01-09 NOTE — Patient Instructions (Addendum)
Check your blood pressure daily in the morning for 2 weeks........ BP: 140/90 or less..... If not at goal return with all the data and the device so we can check the accuracy of your home monitor and adjust medicines as needed  Walk 30 minutes daily  Low-salt diet  Call in July for your physical examination in November,,,,,,,, since you've seen [facility] in the past let's stay with him for your checkups and long-term care  Singulair 10 mg......Marland Kitchen. 1 at bedtime  Claritin 10 mg plain...Marland Kitchen.Marland Kitchen.Marland Kitchen. 1 in the morning

## 2016-01-09 NOTE — Progress Notes (Signed)
Pre visit review using our clinic review tool, if applicable. No additional management support is needed unless otherwise documented below in the visit note. 

## 2016-09-17 ENCOUNTER — Telehealth: Payer: Self-pay | Admitting: Family Medicine

## 2016-09-17 NOTE — Telephone Encounter (Signed)
error 

## 2016-09-18 ENCOUNTER — Encounter: Payer: Self-pay | Admitting: Podiatry

## 2016-09-18 ENCOUNTER — Ambulatory Visit (INDEPENDENT_AMBULATORY_CARE_PROVIDER_SITE_OTHER): Payer: Medicare Other | Admitting: Podiatry

## 2016-09-18 VITALS — Resp 16 | Ht 68.0 in | Wt 180.0 lb

## 2016-09-18 DIAGNOSIS — M722 Plantar fascial fibromatosis: Secondary | ICD-10-CM

## 2016-09-18 DIAGNOSIS — L84 Corns and callosities: Secondary | ICD-10-CM

## 2016-09-18 MED ORDER — TRIAMCINOLONE ACETONIDE 10 MG/ML IJ SUSP
10.0000 mg | Freq: Once | INTRAMUSCULAR | Status: AC
  Administered 2016-09-18: 10 mg

## 2016-09-18 NOTE — Progress Notes (Signed)
   Subjective:    Patient ID: Chad Novemberavid A Calligan Sr., male    DOB: 01-12-1946, [age] y.o.   MRN: [id number]  HPI Chief Complaint  Patient presents with  . Painful lesion    Left foot; bottom of heel; pt stated, "Hurts when does not have shoe on; burns"; x5-6 months      Review of Systems  All other systems reviewed and are negative.      Objective:   Physical Exam        Assessment & Plan:

## 2016-09-18 NOTE — Progress Notes (Signed)
Subjective:    Patient ID: Chad Nephewavid A Delamar Sr., male   DOB: 71 y.o.   MRN: [id number]   HPI patient presents with significant discomfort plantar aspect left heel with inability to walk comfortably on this    Review of Systems  All other systems reviewed and are negative.       Objective:  Physical Exam  Cardiovascular: Intact distal pulses.   Musculoskeletal: Normal range of motion.  Neurological: He is alert.  Skin: Skin is warm.  Nursing note and vitals reviewed.  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

## 2016-09-21 ENCOUNTER — Other Ambulatory Visit: Payer: Self-pay | Admitting: Family Medicine

## 2016-09-24 ENCOUNTER — Ambulatory Visit (INDEPENDENT_AMBULATORY_CARE_PROVIDER_SITE_OTHER): Payer: Medicare Other | Admitting: Adult Health

## 2016-09-24 ENCOUNTER — Encounter: Payer: Self-pay | Admitting: Adult Health

## 2016-09-24 VITALS — BP 150/60 | Temp 97.8°F | Wt 181.0 lb

## 2016-09-24 DIAGNOSIS — I10 Essential (primary) hypertension: Secondary | ICD-10-CM | POA: Diagnosis not present

## 2016-09-24 MED ORDER — LOSARTAN POTASSIUM 50 MG PO TABS: 50.0000 mg | ORAL_TABLET | Freq: Every day | ORAL | 1 refills | Status: DC

## 2016-09-24 MED ORDER — CEPHALEXIN 500 MG PO CAPS: 500.0000 mg | ORAL_CAPSULE | Freq: Two times a day (BID) | ORAL | 0 refills | Status: AC

## 2016-09-24 NOTE — Progress Notes (Signed)
Subjective:    Patient ID: Chad Novemberavid A Rego Sr., male    DOB: 08-24-1945, 71 y.o.   MRN: [id number]  HPI  71 year old male who  has a past medical history of AR (allergic rhinitis); Bilateral club feet; BPH (benign prostatic hyperplasia); FUNGAL DERMATITIS (11/30/2009); HYPERTENSION (01/28/2007); Kidney stone; and PILONIDAL CYST, WITH ABSCESS (08/06/2007). He is a patient of Dr. Tawanna Coolerodd who I am seeing today for the issue of chronic dry cough. He reports that he has had this cough for multiple years and it was always chalked up to seasonal allergy symptoms. He believes that it may be from lisinopril, even though he has taken the medication for close to 20 years .He would like to switch to some other type of blood pressure medication    Review of Systems See HPI   Past Medical History:  Diagnosis Date  . AR (allergic rhinitis)   . Bilateral club feet    surgery and slinting as child  . BPH (benign prostatic hyperplasia)   . FUNGAL DERMATITIS 11/30/2009  . HYPERTENSION 01/28/2007  . Kidney stone    06/2009  . PILONIDAL CYST, WITH ABSCESS 08/06/2007    Social History   Social History  . Marital status: Married    Spouse name: N/A  . Number of children: N/A  . Years of education: N/A   Occupational History  . Not on file.   Social History Main Topics  . Smoking status: Never Smoker  . Smokeless tobacco: Never Used  . Alcohol use Not on file  . Drug use: Unknown  . Sexual activity: Not on file   Other Topics Concern  . Not on file   Social History Narrative  . No narrative on file    Past Surgical History:  Procedure Laterality Date  . CLUB FOOT RELEASE     bilat - as a child    Family History  Problem Relation Age of Onset  . Cancer Neg Hx        colon ca  . Hypertension Neg Hx        familyl hx    Allergies  Allergen Reactions  . Codeine     REACTION: N\T\ V  . Sulfonamide Derivatives     REACTION: rash    Current Outpatient Prescriptions on File Prior to  Visit  Medication Sig Dispense Refill  . albuterol (PROVENTIL HFA;VENTOLIN HFA) 108 (90 BASE) MCG/ACT inhaler Inhale 2 puffs into the lungs every 6 (six) hours as needed for wheezing. 1 Inhaler 2  . FLECTOR 1.3 % PTCH   0  . nystatin-triamcinolone ointment (MYCOLOG) Apply 1 application topically 2 (two) times daily. 60 g 3  . PROAIR HFA 108 (90 Base) MCG/ACT inhaler Inhale 2 puffs into the lungs every 6 (six) hours as needed. 1 Inhaler 2  . triamcinolone cream (KENALOG) 0.1 % Apply topically 2 (two) times daily. 30 g 3  . montelukast (SINGULAIR) 10 MG tablet Take 1 tablet (10 mg total) by mouth at bedtime. (Patient not taking: Reported on 09/24/2016) 100 tablet 4   No current facility-administered medications on file prior to visit.     BP (!) 150/60 (BP Location: Left Arm)   Temp 97.8 F (36.6 C) (Oral)   Wt 181 lb (82.1 kg)   BMI 27.52 kg/m       Objective:   Physical Exam  Constitutional: He is oriented to person, place, and time. He appears well-developed and well-nourished. No distress.  Cardiovascular: Normal rate, regular  rhythm, normal heart sounds and intact distal pulses.  Exam reveals no gallop and no friction rub.   No murmur heard. Pulmonary/Chest: Effort normal and breath sounds normal. No respiratory distress. He has no wheezes. He has no rales. He exhibits no tenderness.  Neurological: He is alert and oriented to person, place, and time.  Skin: Skin is warm and dry. No rash noted. He is not diaphoretic. No erythema. No pallor.  Psychiatric: He has a normal mood and affect. His behavior is normal. Judgment and thought content normal.  Nursing note and vitals reviewed.     Assessment & Plan:  1. Essential hypertension - Will d/c lisinopril and start on Cozzar 50 mg.  - Monitor BP at home. Return precautions given  - losartan (COZAAR) 50 MG tablet; Take 1 tablet (50 mg total) by mouth daily.  Dispense: 30 tablet; Refill: 1 - Follow up in 2 weeks   Shirline Freesory Ermon Sagan,  NP

## 2016-09-24 NOTE — Patient Instructions (Addendum)
It was great seeing you today   I have switched your blood pressure medication from lisinopril to Losartan   Please monitor your blood pressure at home and bring the readings to your appointment in 2 weeks

## 2016-10-08 ENCOUNTER — Ambulatory Visit (INDEPENDENT_AMBULATORY_CARE_PROVIDER_SITE_OTHER): Payer: Medicare Other | Admitting: Adult Health

## 2016-10-08 ENCOUNTER — Encounter: Payer: Self-pay | Admitting: Adult Health

## 2016-10-08 DIAGNOSIS — I10 Essential (primary) hypertension: Secondary | ICD-10-CM | POA: Diagnosis not present

## 2016-10-08 MED ORDER — SILDENAFIL CITRATE 50 MG PO TABS: 50.0000 mg | ORAL_TABLET | Freq: Every day | ORAL | 6 refills | Status: DC | PRN

## 2016-10-08 MED ORDER — LOSARTAN POTASSIUM 50 MG PO TABS: 50.0000 mg | ORAL_TABLET | Freq: Every day | ORAL | 3 refills | Status: DC

## 2016-10-08 NOTE — Progress Notes (Signed)
Subjective:    Patient ID: Chad Yoder., male    DOB: [birth year], 71 y.o.   MRN: [id number]  HPI  71 year old male who  has a past medical history of AR (allergic rhinitis); Bilateral club feet; BPH (benign prostatic hyperplasia); FUNGAL DERMATITIS (11/30/2009); HYPERTENSION (01/28/2007); Kidney stone; and PILONIDAL CYST, WITH ABSCESS (08/06/2007). He presents to the office today for 2 month follow up regarding hypertension. During his last visit he was switched from Lisinopril to Losartan due to chronic cough. He reports that his cough has decreased slightly. He has been monitoring his blood pressure at home and reports that his blood pressures have been slowing decreasing. Per his log, his blood pressures over the last two days has been 136/70.   He denies any side effects of Losartan   He also reports that he is having trouble maintaining an erection. He would like to try Viagra    Review of Systems See HPI   Past Medical History:  Diagnosis Date  . AR (allergic rhinitis)   . Bilateral club feet    surgery and slinting as child  . BPH (benign prostatic hyperplasia)   . FUNGAL DERMATITIS 11/30/2009  . HYPERTENSION 01/28/2007  . Kidney stone    06/2009  . PILONIDAL CYST, WITH ABSCESS 08/06/2007    Social History   Social History  . Marital status: Married    Spouse name: N/A  . Number of children: N/A  . Years of education: N/A   Occupational History  . Not on file.   Social History Main Topics  . Smoking status: Never Smoker  . Smokeless tobacco: Never Used  . Alcohol use Not on file  . Drug use: Unknown  . Sexual activity: Not on file   Other Topics Concern  . Not on file   Social History Narrative  . No narrative on file    Past Surgical History:  Procedure Laterality Date  . CLUB FOOT RELEASE     bilat - as a child    Family History  Problem Relation Age of Onset  . Cancer Neg Hx        colon ca  . Hypertension Neg Hx        familyl hx     Allergies  Allergen Reactions  . Codeine     REACTION: N\T\ V  . Sulfonamide Derivatives     REACTION: rash    Current Outpatient Prescriptions on File Prior to Visit  Medication Sig Dispense Refill  . albuterol (PROVENTIL HFA;VENTOLIN HFA) 108 (90 BASE) MCG/ACT inhaler Inhale 2 puffs into the lungs every 6 (six) hours as needed for wheezing. 1 Inhaler 2  . FLECTOR 1.3 % PTCH   0  . nystatin-triamcinolone ointment (MYCOLOG) Apply 1 application topically 2 (two) times daily. 60 g 3  . PROAIR HFA 108 (90 Base) MCG/ACT inhaler Inhale 2 puffs into the lungs every 6 (six) hours as needed. 1 Inhaler 2  . triamcinolone cream (KENALOG) 0.1 % Apply topically 2 (two) times daily. 30 g 3   No current facility-administered medications on file prior to visit.     BP (!) 146/60 (BP Location: Left Arm)   Temp 98.1 F (36.7 C) (Oral)   Ht 5\' 8"  (1.727 m)   Wt 184 lb (83.5 kg)   BMI 27.98 kg/m       Objective:   Physical Exam  Constitutional: He is oriented to person, place, and time. He appears well-developed and well-nourished. No distress.  Cardiovascular: Normal rate, regular rhythm, normal heart sounds and intact distal pulses.  Exam reveals no gallop and no friction rub.   No murmur heard. Pulmonary/Chest: Effort normal and breath sounds normal. No respiratory distress. He has no wheezes. He has no rales. He exhibits no tenderness.  Neurological: He is alert and oriented to person, place, and time.  Skin: Skin is warm and dry. No rash noted. He is not diaphoretic. No erythema. No pallor.  Psychiatric: He has a normal mood and affect. His behavior is normal. Judgment and thought content normal.  Nursing note and vitals reviewed.     Assessment & Plan:  1. Essential hypertension - losartan (COZAAR) 50 MG tablet; Take 1 tablet (50 mg total) by mouth daily.  Dispense: 90 tablet; Refill: 3-  - Blood pressure improving  - Will not change medication at this time - Advised to  continue to take medications as directed and monitor BP at home - Return precautions given   Shirline Freesory Donyale Berthold, NP

## 2017-01-08 ENCOUNTER — Encounter: Payer: Medicare Other | Admitting: Family Medicine

## 2017-02-03 ENCOUNTER — Ambulatory Visit: Payer: Medicare Other | Admitting: Family Medicine

## 2017-03-10 ENCOUNTER — Encounter: Payer: Self-pay | Admitting: Family Medicine

## 2017-03-10 ENCOUNTER — Ambulatory Visit (INDEPENDENT_AMBULATORY_CARE_PROVIDER_SITE_OTHER): Payer: Medicare Other | Admitting: Family Medicine

## 2017-03-10 VITALS — BP 135/80 | HR 88 | Ht 68.0 in | Wt 183.0 lb

## 2017-03-10 DIAGNOSIS — Z0000 Encounter for general adult medical examination without abnormal findings: Secondary | ICD-10-CM

## 2017-03-10 DIAGNOSIS — I10 Essential (primary) hypertension: Secondary | ICD-10-CM

## 2017-03-10 DIAGNOSIS — Z23 Encounter for immunization: Secondary | ICD-10-CM | POA: Diagnosis not present

## 2017-03-10 DIAGNOSIS — Z85038 Personal history of other malignant neoplasm of large intestine: Secondary | ICD-10-CM

## 2017-03-10 DIAGNOSIS — J301 Allergic rhinitis due to pollen: Secondary | ICD-10-CM

## 2017-03-10 LAB — POCT URINALYSIS DIPSTICK
BILIRUBIN UA: NEGATIVE
GLUCOSE UA: NEGATIVE
Ketones, UA: NEGATIVE
Leukocytes, UA: NEGATIVE
Nitrite, UA: NEGATIVE
Odor: NEGATIVE
Protein, UA: NEGATIVE
RBC UA: NEGATIVE
Spec Grav, UA: 1.015 (ref 1.010–1.025)
Urobilinogen, UA: 0.2 E.U./dL
pH, UA: 6 (ref 5.0–8.0)

## 2017-03-10 MED ORDER — TRIAMCINOLONE ACETONIDE 0.1 % EX CREA: TOPICAL_CREAM | Freq: Two times a day (BID) | CUTANEOUS | 3 refills | Status: DC

## 2017-03-10 MED ORDER — LOSARTAN POTASSIUM 50 MG PO TABS: 50.0000 mg | ORAL_TABLET | Freq: Every day | ORAL | 4 refills | Status: DC

## 2017-03-10 NOTE — Progress Notes (Signed)
Chad Yoder is a 72 year old married male nonsmoker who comes in today for general physical examination because of a history of hypertension mild ED  For hypertension he takes losartan 50 mg daily. BP which he monitors daily at home is 135/70.  He uses Viagra generic when necessary for ED  He uses Kenalog cream when necessary for rashes  He gets routine eye care... Cataracts removed and lens implants 10 years ago regular dental care, colonoscopy 2012 normal  Vaccinations he declines a flu shot said he had a reaction to it years ago. Does agree to take the Prevnar. Information given about the new shingles vaccine.  14 point review of systems June otherwise negative  Cognitive function normal he walks daily home health safety reviewed no issues identified, no guns in the house, he does have a healthcare power of attorney and living well.  Hepatitis C not indicated  He sees his urologist on a regular basis. He's had a history of chronic prostatitis.  BP 135/80 (BP Location: Left Arm, Patient Position: Sitting, Cuff Size: Normal)   Pulse 88   Ht 5\' 8"  (1.727 m)   Wt 183 lb (83 kg)   BMI 27.83 kg/m  Is well-developed well-nourished male no acute distress examination HEENT is pertinent that both lenses are pigmented. Advised to go back to see his ophthalmologist he maybe didn't be re-lasered. Otherwise ENT exam is normal neck was supple thyroid is not enlarged no adenopathy no carotid bruits. Cardiopulmonary exam normal abdominal exam normal aorta normal. Genitalia done by urologist therefore not repeated extremities normal skin normal peripheral pulses normal........ he sees his dermatologist Dr. Yetta BarreJones twice a year  #1 healthy male  #2 hypertension at goal ........... continue current therapy  #3 status post bilateral cataracts and lens implants with cloudiness of both lenses ......... advised to see his ophthalmologist for follow-up  #4 ......Marland Kitchen. mild ED ........Marland Kitchen. Viagra when necessary  #5  .allergic rhinitis ........ patient requesting consult OTC meds not working .................Marland Kitchen

## 2017-03-10 NOTE — Patient Instructions (Signed)
Call the allergy office across the street and make an appointment to see Dr. Lawson FiscalWhalen  Labs today........... I will call a physician abnormal  Continue current meds  Continue blood pressure monitoring  Return in one year for general physical exam sooner if any problems  You might consider consulting with your ophthalmologist because the lenses are getting pigmented

## 2017-03-11 LAB — CBC WITH DIFFERENTIAL/PLATELET
BASOS ABS: 0.1 10*3/uL (ref 0.0–0.1)
BASOS PCT: 0.8 % (ref 0.0–3.0)
EOS ABS: 0.1 10*3/uL (ref 0.0–0.7)
Eosinophils Relative: 1.3 % (ref 0.0–5.0)
HEMATOCRIT: 44.9 % (ref 39.0–52.0)
HEMOGLOBIN: 15.2 g/dL (ref 13.0–17.0)
LYMPHS PCT: 18.2 % (ref 12.0–46.0)
Lymphs Abs: 1.6 10*3/uL (ref 0.7–4.0)
MCHC: 33.8 g/dL (ref 30.0–36.0)
MCV: 87.7 fl (ref 78.0–100.0)
Monocytes Absolute: 0.6 10*3/uL (ref 0.1–1.0)
Monocytes Relative: 7 % (ref 3.0–12.0)
Neutro Abs: 6.4 10*3/uL (ref 1.4–7.7)
Neutrophils Relative %: 72.7 % (ref 43.0–77.0)
Platelets: 239 10*3/uL (ref 150.0–400.0)
RBC: 5.12 Mil/uL (ref 4.22–5.81)
RDW: 13.3 % (ref 11.5–15.5)
WBC: 8.7 10*3/uL (ref 4.0–10.5)

## 2017-03-11 LAB — PSA: PSA: 1.2 ng/mL (ref 0.10–4.00)

## 2017-03-11 LAB — HEPATIC FUNCTION PANEL
ALT: 17 U/L (ref 0–53)
AST: 18 U/L (ref 0–37)
Albumin: 4.5 g/dL (ref 3.5–5.2)
Alkaline Phosphatase: 73 U/L (ref 39–117)
BILIRUBIN TOTAL: 1.1 mg/dL (ref 0.2–1.2)
Bilirubin, Direct: 0.2 mg/dL (ref 0.0–0.3)
Total Protein: 6.6 g/dL (ref 6.0–8.3)

## 2017-03-11 LAB — LIPID PANEL
CHOLESTEROL: 213 mg/dL — AB (ref 0–200)
HDL: 46.7 mg/dL (ref >39.00)
LDL CALC: 146 mg/dL — AB (ref 0–99)
NonHDL: 165.91
Total CHOL/HDL Ratio: 5
Triglycerides: 100 mg/dL (ref 0.0–149.0)
VLDL: 20 mg/dL (ref 0.0–40.0)

## 2017-03-11 LAB — TSH: TSH: 1.74 u[IU]/mL (ref 0.35–4.50)

## 2017-03-11 LAB — BASIC METABOLIC PANEL
BUN: 17 mg/dL (ref 6–23)
CHLORIDE: 101 meq/L (ref 96–112)
CO2: 30 meq/L (ref 19–32)
Calcium: 9.2 mg/dL (ref 8.4–10.5)
Creatinine, Ser: 0.98 mg/dL (ref 0.40–1.50)
GFR: 80.08 mL/min (ref >60.00)
GLUCOSE: 95 mg/dL (ref 70–99)
POTASSIUM: 4 meq/L (ref 3.5–5.1)
SODIUM: 138 meq/L (ref 135–145)

## 2017-03-19 ENCOUNTER — Telehealth: Payer: Self-pay | Admitting: Family Medicine

## 2017-03-19 NOTE — Telephone Encounter (Signed)
Copied from CRM [phone]. Topic: Quick Communication - See Telephone Encounter >> Mar 19, 2017  2:35 PM Diana EvesHoyt, Maryann B wrote: CRM for notification. See Telephone encounter for:  Pt wants Dr. Tawanna Coolerodd to know that he needs the albuterol added back to his current meds. He had marked it off his list at his cpe appt and he does indeed use it in the spring due to asthma and allergies. He would also like refill called in to [facility] 03[facility]19/[facility].

## 2017-03-19 NOTE — Telephone Encounter (Signed)
See attached.  Thanks!

## 2017-03-20 ENCOUNTER — Other Ambulatory Visit: Payer: Self-pay

## 2017-03-20 MED ORDER — ALBUTEROL SULFATE HFA 108 (90 BASE) MCG/ACT IN AERS: 2.0000 | INHALATION_SPRAY | Freq: Four times a day (QID) | RESPIRATORY_TRACT | 2 refills | Status: DC | PRN

## 2017-03-20 NOTE — Telephone Encounter (Signed)
Rx for Albuterol was sent to pt pharmacy as requested

## 2017-04-22 ENCOUNTER — Ambulatory Visit
Admission: RE | Admit: 2017-04-22 | Discharge: 2017-04-22 | Disposition: A | Discharge status: Home / Self Care | Payer: Medicare Other | Source: Ambulatory Visit | Attending: Allergy and Immunology | Admitting: Allergy and Immunology

## 2017-04-22 ENCOUNTER — Other Ambulatory Visit: Payer: Self-pay | Admitting: Allergy and Immunology

## 2017-04-22 DIAGNOSIS — R05 Cough: Secondary | ICD-10-CM

## 2017-04-22 DIAGNOSIS — R059 Cough, unspecified: Secondary | ICD-10-CM

## 2017-04-29 ENCOUNTER — Other Ambulatory Visit: Payer: Self-pay | Admitting: Adult Health

## 2017-04-29 NOTE — Telephone Encounter (Signed)
Called pt left a message to return my call in regards to pharmacy request for Rx Cephalexin 500 mg per Dr Tawanna Coolerodd.

## 2017-04-30 NOTE — Telephone Encounter (Signed)
I spoke with pt to get more information. Pt starts his spring travels soon and will be on the road for several weeks. He usually takes a script with him just in case he needs antibiotic while traveling, says Dr. Tawanna Coolerodd has written in the past and [employer] Nafziger.

## 2017-05-01 NOTE — Telephone Encounter (Signed)
Medication has been sent to the pharmacy for refill per Dr Tawanna Coolerodd.

## 2018-03-18 ENCOUNTER — Other Ambulatory Visit: Payer: Self-pay | Admitting: *Deleted

## 2018-03-18 NOTE — Telephone Encounter (Signed)
[facility] [address] [phone] losartan (COZAAR) 50 MG tablet Is on backorder/unavailable   Patient has a TOC appointment 06/11/2018  Please advise

## 2018-03-19 ENCOUNTER — Other Ambulatory Visit: Payer: Self-pay | Admitting: Adult Health

## 2018-03-19 MED ORDER — LOSARTAN POTASSIUM 25 MG PO TABS: 25.0000 mg | ORAL_TABLET | Freq: Two times a day (BID) | ORAL | 3 refills | Status: DC

## 2018-03-19 NOTE — Telephone Encounter (Signed)
Do they have the 25 mg tabs? Take two tabs daily

## 2018-03-19 NOTE — Telephone Encounter (Signed)
Rx sent 

## 2018-03-19 NOTE — Telephone Encounter (Signed)
Patient returned call- he is fine with taking 2/ 25 mg tablets daily. Call to pharmacy- they do have 25 mg dosing in stock- will send message for change in Rx.

## 2018-03-19 NOTE — Telephone Encounter (Signed)
Left message on machine for patient to return our call CRM 

## 2018-06-02 ENCOUNTER — Other Ambulatory Visit: Payer: Self-pay | Admitting: Adult Health

## 2018-06-02 NOTE — Telephone Encounter (Signed)
Pt has a new pt appt on 06/11/2018

## 2018-06-02 NOTE — Telephone Encounter (Signed)
Ok for 30 days 

## 2018-06-03 NOTE — Telephone Encounter (Signed)
Sent to the pharmacy by e-scribe. 

## 2018-06-11 ENCOUNTER — Encounter: Payer: Medicare Other | Admitting: Adult Health

## 2018-06-25 ENCOUNTER — Other Ambulatory Visit: Payer: Self-pay | Admitting: Adult Health

## 2018-06-26 NOTE — Telephone Encounter (Signed)
Ok to refill for 90 days  

## 2018-06-26 NOTE — Telephone Encounter (Signed)
Pt has an appt to establish with you in June.  Please advise.

## 2018-06-26 NOTE — Telephone Encounter (Signed)
Sent to the pharmacy by e-scribe. 

## 2018-08-03 IMAGING — CR DG CHEST 2V
2 series · 2 of 2 positions shown · non-contrast
Comparison: None.

CLINICAL DATA: Cough for 1 month

EXAM:
CHEST  2 VIEW

[w chest pa]
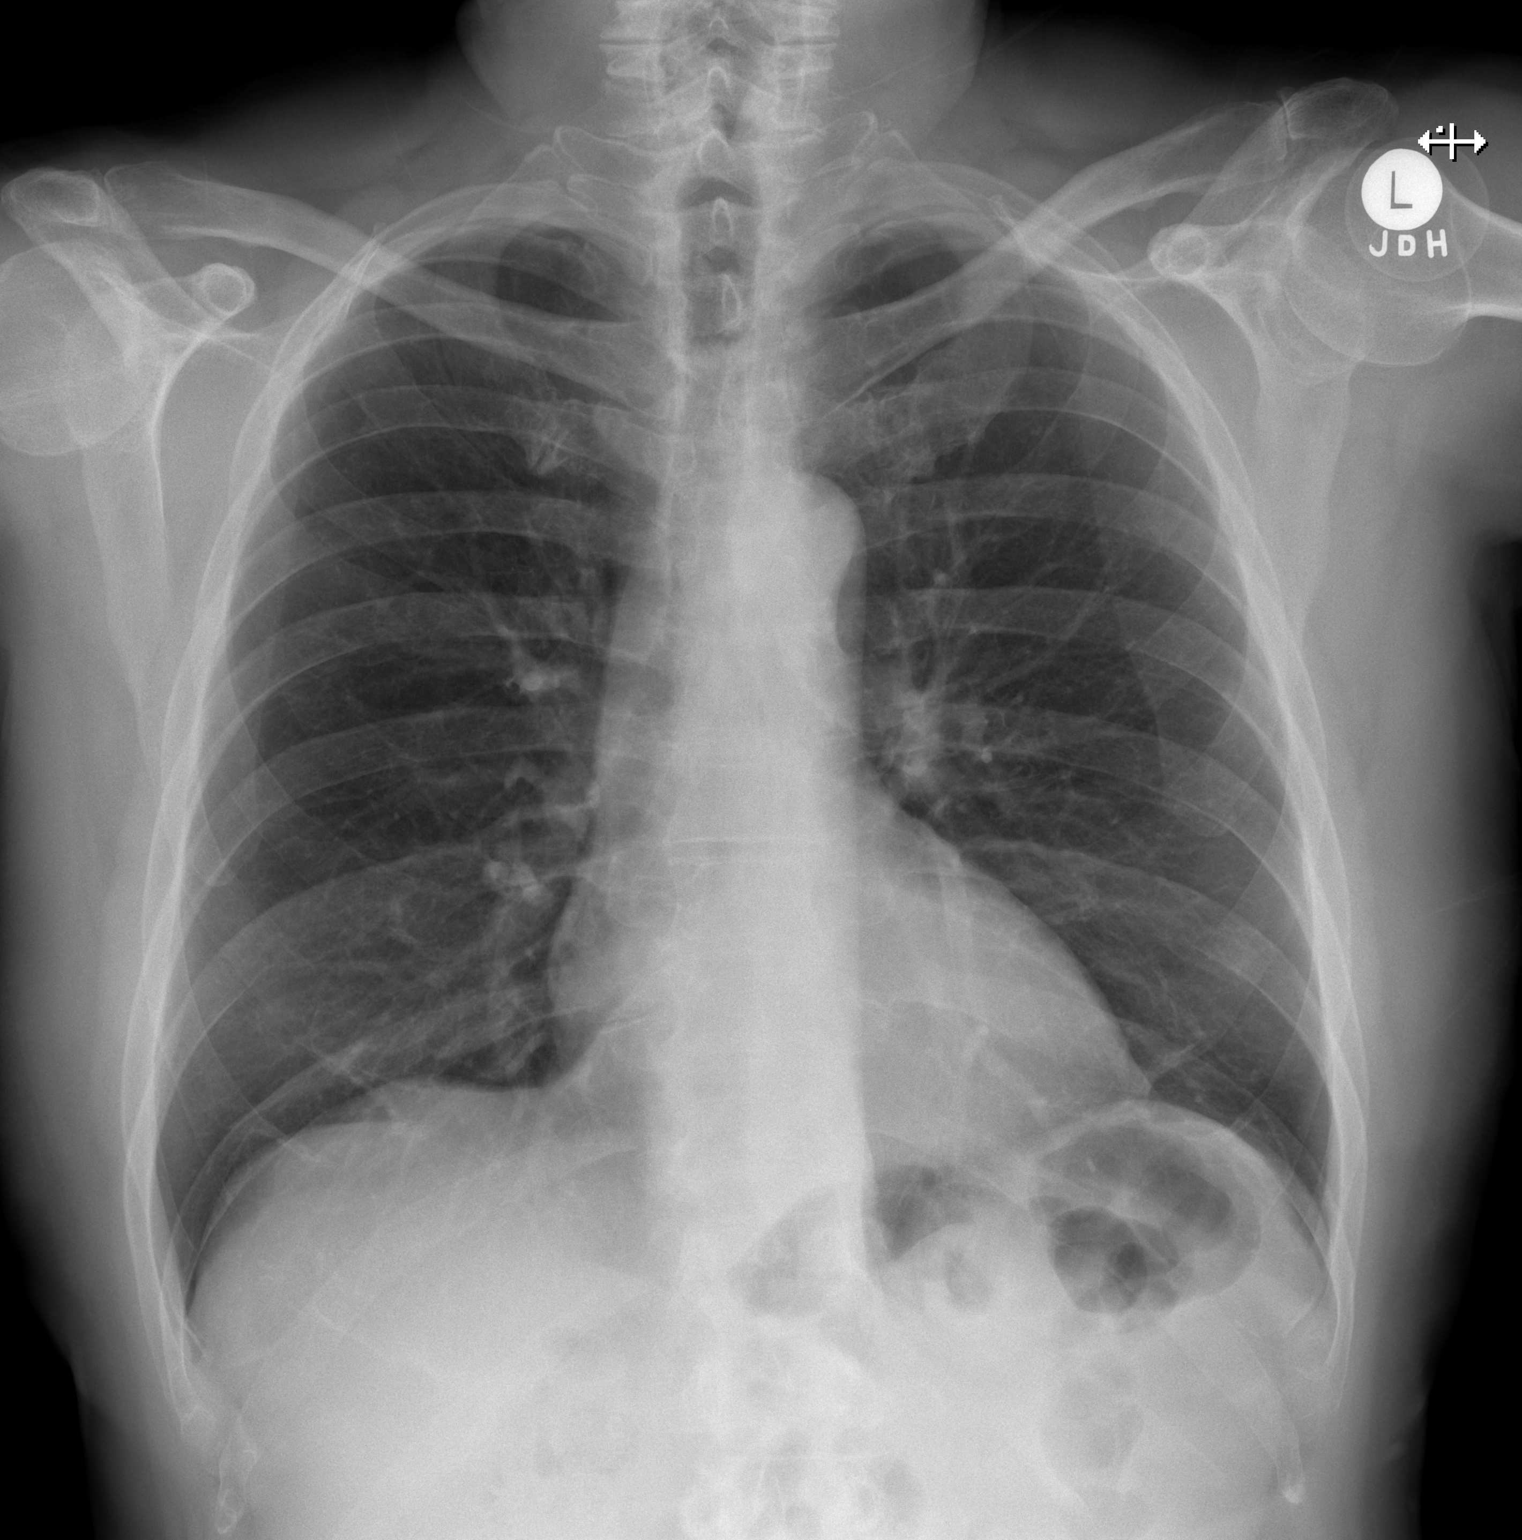

[w chest lat]
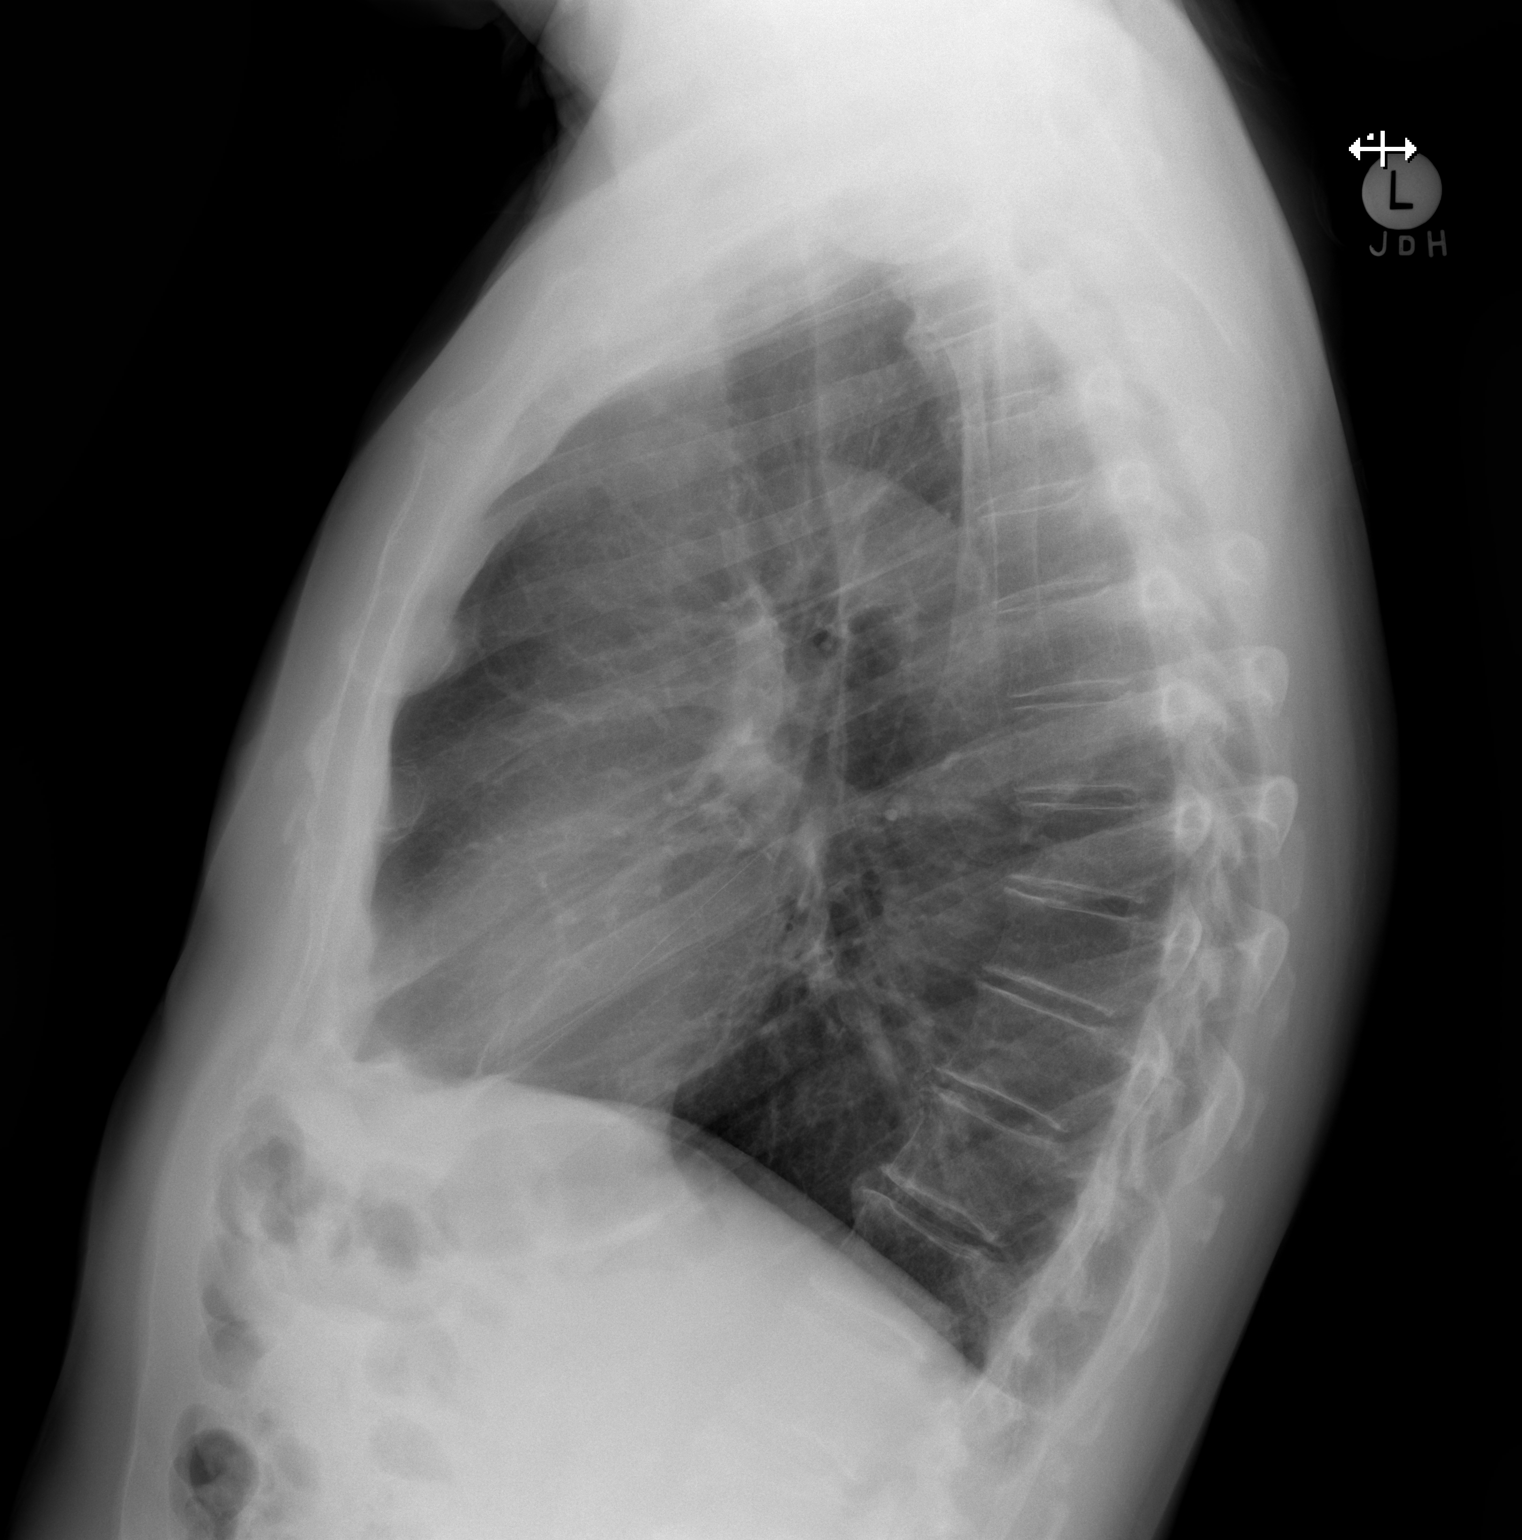

[2 of 2 positions shown; findings below may reference images not displayed]

FINDINGS: Lungs are clear. Heart size and pulmonary vascularity are normal. No
adenopathy. No pneumothorax. No bone lesions.
IMPRESSION: No edema or consolidation.

## 2018-08-12 ENCOUNTER — Ambulatory Visit: Payer: Medicare Other | Admitting: Adult Health

## 2018-08-19 ENCOUNTER — Telehealth: Payer: Self-pay | Admitting: *Deleted

## 2018-08-19 NOTE — Telephone Encounter (Signed)
Copied from [place] [id number]. Topic: General - Other >> Aug 19, 2018  2:16 PM Celene Kras A wrote: Reason for CRM: Pts wife called stating there is a party that they are wanting to attend and they are trying to get advice on wether it would be safe for them to go. Please advise.

## 2018-08-20 NOTE — Telephone Encounter (Signed)
Patient has an upcoming appointment with Tommi Rumps

## 2018-08-21 NOTE — Telephone Encounter (Signed)
Spoke to the pt.  Pt states there is a first responders BBQ party this weekend.  He and his wife would like to attend.  Informed the pt that due to his age and hx of asthma that he is more at risk for the virus.  Advised that he should be wearing a mask, washing his hands and social distancing when leaving his home.  Pt states he understands and will take this into consideration.  Advised a call back if needed.

## 2018-09-23 ENCOUNTER — Ambulatory Visit: Payer: Medicare Other | Admitting: Adult Health

## 2018-10-08 ENCOUNTER — Other Ambulatory Visit: Payer: Self-pay | Admitting: Adult Health

## 2018-12-28 ENCOUNTER — Other Ambulatory Visit: Payer: Self-pay | Admitting: Adult Health

## 2018-12-29 NOTE — Telephone Encounter (Signed)
Sent to the pharmacy by e-scribe. 

## 2018-12-29 NOTE — Telephone Encounter (Signed)
Can give 30 days. If he cancels his transfer of care appointment again he is going to have to reschedule with someone else.

## 2018-12-29 NOTE — Telephone Encounter (Signed)
Pt has moved transfer of care visit 4 times.  Not seen by Dr. Sherren Mocha since Jan 2019.  Please advise.

## 2019-01-06 ENCOUNTER — Ambulatory Visit: Payer: Medicare Other | Admitting: Adult Health

## 2019-01-24 ENCOUNTER — Other Ambulatory Visit: Payer: Self-pay | Admitting: Adult Health

## 2019-02-09 ENCOUNTER — Other Ambulatory Visit: Payer: Self-pay

## 2019-02-09 ENCOUNTER — Encounter: Payer: Self-pay | Admitting: Adult Health

## 2019-02-09 ENCOUNTER — Ambulatory Visit (INDEPENDENT_AMBULATORY_CARE_PROVIDER_SITE_OTHER): Payer: Medicare Other | Admitting: Adult Health

## 2019-02-09 DIAGNOSIS — Z8709 Personal history of other diseases of the respiratory system: Secondary | ICD-10-CM

## 2019-02-09 DIAGNOSIS — I10 Essential (primary) hypertension: Secondary | ICD-10-CM | POA: Diagnosis not present

## 2019-02-09 DIAGNOSIS — Z7689 Persons encountering health services in other specified circumstances: Secondary | ICD-10-CM

## 2019-02-09 MED ORDER — PREDNISONE 10 MG PO TABS: ORAL_TABLET | ORAL | 0 refills | Status: DC

## 2019-02-09 MED ORDER — LOSARTAN POTASSIUM 25 MG PO TABS: 25.0000 mg | ORAL_TABLET | Freq: Two times a day (BID) | ORAL | 0 refills | Status: DC

## 2019-02-09 MED ORDER — CEPHALEXIN 500 MG PO CAPS: 500.0000 mg | ORAL_CAPSULE | Freq: Two times a day (BID) | ORAL | 0 refills | Status: DC

## 2019-02-09 NOTE — Progress Notes (Signed)
Virtual Visit via Video Note  I connected withDavid A Killmer Sr. on 02/09/19 at  8:30 AM EST by a video enabled telemedicine application and verified that I am speaking with the correct person using two identifiers.  Location patient: home Location provider:work or home office Persons participating in the virtual visit: patient, provider  I discussed the limitations of evaluation and management by telemedicine and the availability of in person appointments. The patient expressed understanding and agreed to proceed.   Patient presents to clinic today to establish care.  Acute Concerns: Establish Care  Chronic Issues: Essential Hypertension - Takes Cozaar 25 mg BID. He does monitor his BP at home and reports readings consistently in the 140/80's.  Denies CP, SOB, dizziness, lightheadedness or blurred vision  BP Readings from Last 3 Encounters:  03/10/17 135/80  10/08/16 (!) 146/60  09/24/16 (!) 150/60   Asthma - uses infrequent. He likes to have prednisone on hand if he has an acute flare.   Health Maintenance: Dental -- Routine Care  Vision -- Routine Care  Immunizations -- Due for flu shot.  Colonoscopy -- 2012 - 10 year plan   Treatment Teat  - Dermatology - yearly.   Past Medical History:  Diagnosis Date  . AR (allergic rhinitis)   . Bilateral club feet    surgery and slinting as child  . BPH (benign prostatic hyperplasia)   . FUNGAL DERMATITIS 11/30/2009  . HYPERTENSION 01/28/2007  . Kidney stone    06/2009  . PILONIDAL CYST, WITH ABSCESS 08/06/2007    Past Surgical History:  Procedure Laterality Date  . CLUB FOOT RELEASE     bilat - as a child    Current Outpatient Medications on File Prior to Visit  Medication Sig Dispense Refill  . albuterol (PROVENTIL HFA;VENTOLIN HFA) 108 (90 Base) MCG/ACT inhaler Inhale 2 puffs into the lungs every 6 (six) hours as needed for wheezing. 1 Inhaler 2  . cephALEXin (KEFLEX) 500 MG capsule TAKE ONE CAPSULE BY MOUTH TWICE  A DAY 20 capsule 0  . FLECTOR 1.3 % PTCH   0  . losartan (COZAAR) 25 MG tablet TAKE 1 TABLET BY MOUTH TWICE A DAY 60 tablet 0  . losartan (COZAAR) 50 MG tablet Take 1 tablet (50 mg total) by mouth daily. 90 tablet 4  . sildenafil (VIAGRA) 50 MG tablet Take 1 tablet (50 mg total) by mouth daily as needed for erectile dysfunction. 10 tablet 6  . triamcinolone cream (KENALOG) 0.1 % Apply topically 2 (two) times daily. 30 g 3   No current facility-administered medications on file prior to visit.    Allergies  Allergen Reactions  . Codeine     REACTION: N\T\ V  . Statins   . Sulfonamide Derivatives     REACTION: rash    Family History  Problem Relation Age of Onset  . Cancer Neg Hx        colon ca  . Hypertension Neg Hx        familyl hx    Social History   Socioeconomic History  . Marital status: Married    Spouse name: Not on file  . Number of children: Not on file  . Years of education: Not on file  . Highest education level: Not on file  Occupational History  . Not on file  Tobacco Use  . Smoking status: Never Smoker  . Smokeless tobacco: Never Used  Substance and Sexual Activity  . Alcohol use: Not on file  .  Drug use: Not on file  . Sexual activity: Not on file  Other Topics Concern  . Not on file  Social History Narrative  . Not on file   Social Determinants of Health   Financial Resource Strain:   . Difficulty of Paying Living Expenses: Not on file  Food Insecurity:   . Worried About Programme researcher, broadcasting/film/video in the Last Year: Not on file  . Ran Out of Food in the Last Year: Not on file  Transportation Needs:   . Lack of Transportation (Medical): Not on file  . Lack of Transportation (Non-Medical): Not on file  Physical Activity:   . Days of Exercise per Week: Not on file  . Minutes of Exercise per Session: Not on file  Stress:   . Feeling of Stress : Not on file  Social Connections:   . Frequency of Communication with Friends and Family: Not on file  .  Frequency of Social Gatherings with Friends and Family: Not on file  . Attends Religious Services: Not on file  . Active Member of Clubs or Organizations: Not on file  . Attends Banker Meetings: Not on file  . Marital Status: Not on file  Intimate Partner Violence:   . Fear of Current or Ex-Partner: Not on file  . Emotionally Abused: Not on file  . Physically Abused: Not on file  . Sexually Abused: Not on file    Review of Systems  Constitutional: Negative.   HENT: Negative.   Eyes: Negative.   Respiratory: Negative.   Cardiovascular: Negative.   Gastrointestinal: Negative.   Genitourinary: Negative.   Musculoskeletal: Negative.   Skin: Negative.   Neurological: Negative.   Endo/Heme/Allergies: Negative.   Psychiatric/Behavioral: Negative.   All other systems reviewed and are negative.     Physical Exam VITALS per patient if applicable:  GENERAL: alert, oriented, appears well and in no acute distress  HEENT: atraumatic, conjunttiva clear, no obvious abnormalities on inspection of external nose and ears  NECK: normal movements of the head and neck  LUNGS: on inspection no signs of respiratory distress, breathing rate appears normal, no obvious gross SOB, gasping or wheezing  CV: no obvious cyanosis  MS: moves all visible extremities without noticeable abnormality  PSYCH/NEURO: pleasant and cooperative, no obvious depression or anxiety, speech and thought processing grossly intact  Assessment/Plan:  Discussed the following assessment and plan:  1. Encounter to establish care - Follow up for CPE - he would like to wait until after he gets vaccination for COVID   2. Essential hypertension - losartan (COZAAR) 25 MG tablet; Take 1 tablet (25 mg total) by mouth 2 (two) times daily.  Dispense: 180 tablet; Refill: 0  3. History of asthma  - predniSONE (DELTASONE) 10 MG tablet; 40 mg x 3 days, 20 mg x 3 days, 10 mg x 3 days  Dispense: 21 tablet; Refill:  0   I discussed the assessment and treatment plan with the patient. The patient was provided an opportunity to ask questions and all were answered. The patient agreed with the plan and demonstrated an understanding of the instructions.   The patient was advised to call back or seek an in-person evaluation if the symptoms worsen or if the condition fails to improve as anticipated.   Shirline Frees, NP

## 2019-03-09 ENCOUNTER — Other Ambulatory Visit: Payer: Self-pay | Admitting: Adult Health

## 2019-03-09 DIAGNOSIS — I10 Essential (primary) hypertension: Secondary | ICD-10-CM

## 2019-03-22 ENCOUNTER — Ambulatory Visit: Payer: Medicare PPO | Attending: Internal Medicine

## 2019-03-22 DIAGNOSIS — Z23 Encounter for immunization: Secondary | ICD-10-CM

## 2019-03-22 NOTE — Progress Notes (Signed)
   [id number]  Name:  Chad BROCKEL Sr.    MRN: [id number] DOB: 1946/02/21  03/22/2019  [name] was observed post Covid-19 immunization for 15 minutes without incidence. He was provided with Vaccine Information Sheet and instruction to access the V-Safe system.   [name] was instructed to call 911 with any severe reactions post vaccine: Marland Kitchen Difficulty breathing  . Swelling of your face and throat  . A fast heartbeat  . A bad rash all over your body  . Dizziness and weakness    Immunizations Administered    Name Date Dose VIS Date Route   Pfizer COVID-19 Vaccine 03/22/2019  4:35 PM 0.3 mL 02/05/2019 Intramuscular   Manufacturer: [employer], [employer]   Lot: [id number]   NDC: [address]-0

## 2019-04-12 ENCOUNTER — Ambulatory Visit: Payer: Medicare PPO | Attending: Internal Medicine

## 2019-04-12 DIAGNOSIS — Z23 Encounter for immunization: Secondary | ICD-10-CM

## 2019-04-12 NOTE — Progress Notes (Signed)
   [id number]  Name:  Chad WHANG Sr.    MRN: [id number] DOB: 08/28/1945  04/12/2019  [name] was observed post Covid-19 immunization for 15 minutes without incidence. He was provided with Vaccine Information Sheet and instruction to access the V-Safe system.   [name] was instructed to call 911 with any severe reactions post vaccine: Marland Kitchen Difficulty breathing  . Swelling of your face and throat  . A fast heartbeat  . A bad rash all over your body  . Dizziness and weakness    Immunizations Administered    Name Date Dose VIS Date Route   Pfizer COVID-19 Vaccine 04/12/2019 12:38 PM 0.3 mL 02/05/2019 Intramuscular   Manufacturer: [employer], [employer]   Lot: [id number]   NDC: [address]-2

## 2019-06-07 ENCOUNTER — Other Ambulatory Visit: Payer: Self-pay

## 2019-06-08 ENCOUNTER — Ambulatory Visit (INDEPENDENT_AMBULATORY_CARE_PROVIDER_SITE_OTHER): Payer: Medicare PPO | Admitting: Adult Health

## 2019-06-08 ENCOUNTER — Encounter: Payer: Self-pay | Admitting: Adult Health

## 2019-06-08 VITALS — BP 146/80 | Temp 97.8°F | Ht 68.5 in | Wt 169.0 lb

## 2019-06-08 DIAGNOSIS — Z125 Encounter for screening for malignant neoplasm of prostate: Secondary | ICD-10-CM

## 2019-06-08 DIAGNOSIS — Z8709 Personal history of other diseases of the respiratory system: Secondary | ICD-10-CM

## 2019-06-08 DIAGNOSIS — I10 Essential (primary) hypertension: Secondary | ICD-10-CM

## 2019-06-08 DIAGNOSIS — Z0000 Encounter for general adult medical examination without abnormal findings: Secondary | ICD-10-CM | POA: Diagnosis not present

## 2019-06-08 LAB — COMPREHENSIVE METABOLIC PANEL
ALT: 14 U/L (ref 0–53)
AST: 16 U/L (ref 0–37)
Albumin: 4.4 g/dL (ref 3.5–5.2)
Alkaline Phosphatase: 77 U/L (ref 39–117)
BUN: 17 mg/dL (ref 6–23)
CO2: 28 mEq/L (ref 19–32)
Calcium: 9.3 mg/dL (ref 8.4–10.5)
Chloride: 102 mEq/L (ref 96–112)
Creatinine, Ser: 0.98 mg/dL (ref 0.40–1.50)
GFR: 74.87 mL/min (ref >60.00)
Glucose, Bld: 101 mg/dL — ABNORMAL HIGH (ref 70–99)
Potassium: 3.8 mEq/L (ref 3.5–5.1)
Sodium: 138 mEq/L (ref 135–145)
Total Bilirubin: 1.1 mg/dL (ref 0.2–1.2)
Total Protein: 6.4 g/dL (ref 6.0–8.3)

## 2019-06-08 LAB — CBC WITH DIFFERENTIAL/PLATELET
Basophils Absolute: 0 10*3/uL (ref 0.0–0.1)
Basophils Relative: 0.5 % (ref 0.0–3.0)
Eosinophils Absolute: 0.1 10*3/uL (ref 0.0–0.7)
Eosinophils Relative: 1.9 % (ref 0.0–5.0)
HCT: 42.4 % (ref 39.0–52.0)
Hemoglobin: 14.7 g/dL (ref 13.0–17.0)
Lymphocytes Relative: 20 % (ref 12.0–46.0)
Lymphs Abs: 1.5 10*3/uL (ref 0.7–4.0)
MCHC: 34.7 g/dL (ref 30.0–36.0)
MCV: 86.1 fl (ref 78.0–100.0)
Monocytes Absolute: 0.6 10*3/uL (ref 0.1–1.0)
Monocytes Relative: 8.6 % (ref 3.0–12.0)
Neutro Abs: 5.1 10*3/uL (ref 1.4–7.7)
Neutrophils Relative %: 69 % (ref 43.0–77.0)
Platelets: 217 10*3/uL (ref 150.0–400.0)
RBC: 4.93 Mil/uL (ref 4.22–5.81)
RDW: 13 % (ref 11.5–15.5)
WBC: 7.4 10*3/uL (ref 4.0–10.5)

## 2019-06-08 LAB — TSH: TSH: 1.93 u[IU]/mL (ref 0.35–4.50)

## 2019-06-08 LAB — LIPID PANEL
Cholesterol: 203 mg/dL — ABNORMAL HIGH (ref 0–200)
HDL: 40.2 mg/dL (ref >39.00)
LDL Cholesterol: 146 mg/dL — ABNORMAL HIGH (ref 0–99)
NonHDL: 162.65
Total CHOL/HDL Ratio: 5
Triglycerides: 84 mg/dL (ref 0.0–149.0)
VLDL: 16.8 mg/dL (ref 0.0–40.0)

## 2019-06-08 NOTE — Progress Notes (Signed)
Subjective:    Patient ID: Chad Lapping., male    DOB: September 28, 1945, 74 y.o.   MRN: [id number]  HPI Patient presents for yearly preventative medicine examination. He is a pleasant 74 year old male who  has a past medical history of AR (allergic rhinitis), Bilateral club feet, BPH (benign prostatic hyperplasia), FUNGAL DERMATITIS (11/30/2009), HYPERTENSION (01/28/2007), Kidney stone, and PILONIDAL CYST, WITH ABSCESS (08/06/2007).  Essential Hypertension -takes Cozaar 25 mg twice daily.  Does monitor his blood pressures at home and reports readings in the 130s to 140s over 80s.  He denies chest pain, shortness of breath, dizziness, lightheadedness, or blurred vision BP Readings from Last 3 Encounters:  03/10/17 135/80  10/08/16 (!) 146/60  09/24/16 (!) 150/60   Asthma -he has a albuterol inhaler that he uses infrequently. Is seen by [facility].   BPH - Is seen by urology on a routine basis. He is not on any medication for his BPH. Over the last year he has noticed decreased stream. He has an upcoming appointment   All immunizations and health maintenance protocols were reviewed with the patient and needed orders were placed. He is up to date on routine vaccinations   Appropriate screening laboratory values were ordered for the patient including screening of hyperlipidemia, renal function and hepatic function. If indicated by BPH, a PSA was ordered.  Medication reconciliation,  past medical history, social history, problem list and allergies were reviewed in detail with the patient  Goals were established with regard to weight loss, exercise, and  diet in compliance with medications. He has not been eating out and has been waking for 30-52min per day.    Wt Readings from Last 3 Encounters:  06/08/19 169 lb (76.7 kg)  03/10/17 183 lb (83 kg)  10/08/16 184 lb (83.5 kg)    He is due for his 10-year colonoscopy in 2022.  He is up-to-date on routine dental and vision  screens.  He denies any acute issues and is doing well overall.    Review of Systems  Constitutional: Negative.   HENT: Negative.   Eyes: Negative.   Respiratory: Negative.   Cardiovascular: Negative.   Gastrointestinal: Negative.   Endocrine: Negative.   Genitourinary: Negative.   Musculoskeletal: Negative.   Skin: Negative.   Allergic/Immunologic: Negative.   Neurological: Negative.   Hematological: Negative.   Psychiatric/Behavioral: Negative.   All other systems reviewed and are negative.  Past Medical History:  Diagnosis Date  . AR (allergic rhinitis)   . Bilateral club feet    surgery and slinting as child  . BPH (benign prostatic hyperplasia)   . FUNGAL DERMATITIS 11/30/2009  . HYPERTENSION 01/28/2007  . Kidney stone    06/2009  . PILONIDAL CYST, WITH ABSCESS 08/06/2007    Social History   Socioeconomic History  . Marital status: Married    Spouse name: Not on file  . Number of children: Not on file  . Years of education: Not on file  . Highest education level: Not on file  Occupational History  . Not on file  Tobacco Use  . Smoking status: Never Smoker  . Smokeless tobacco: Never Used  Substance and Sexual Activity  . Alcohol use: Yes    Comment: rarley   . Drug use: Not on file  . Sexual activity: Not on file  Other Topics Concern  . Not on file  Social History Narrative  . Not on file   Social Determinants of Health  Financial Resource Strain:   . Difficulty of Paying Living Expenses:   Food Insecurity:   . Worried About Programme researcher, broadcasting/film/video in the Last Year:   . Barista in the Last Year:   Transportation Needs:   . Freight forwarder (Medical):   Marland Kitchen Lack of Transportation (Non-Medical):   Physical Activity:   . Days of Exercise per Week:   . Minutes of Exercise per Session:   Stress:   . Feeling of Stress :   Social Connections:   . Frequency of Communication with Friends and Family:   . Frequency of Social Gatherings with  Friends and Family:   . Attends Religious Services:   . Active Member of Clubs or Organizations:   . Attends Banker Meetings:   Marland Kitchen Marital Status:   Intimate Partner Violence:   . Fear of Current or Ex-Partner:   . Emotionally Abused:   Marland Kitchen Physically Abused:   . Sexually Abused:     Past Surgical History:  Procedure Laterality Date  . CLUB FOOT RELEASE     bilat - as a child    Family History  Problem Relation Age of Onset  . Cancer Neg Hx        colon ca  . Hypertension Neg Hx        familyl hx    Allergies  Allergen Reactions  . Codeine     REACTION: N\T\ V  . Statins   . Sulfonamide Derivatives     REACTION: rash    Current Outpatient Medications on File Prior to Visit  Medication Sig Dispense Refill  . albuterol (PROVENTIL HFA;VENTOLIN HFA) 108 (90 Base) MCG/ACT inhaler Inhale 2 puffs into the lungs every 6 (six) hours as needed for wheezing. 1 Inhaler 2  . cephALEXin (KEFLEX) 500 MG capsule Take 1 capsule (500 mg total) by mouth 2 (two) times daily. 20 capsule 0  . FLECTOR 1.3 % PTCH   0  . losartan (COZAAR) 25 MG tablet TAKE 1 TABLET BY MOUTH TWICE A DAY 180 tablet 0  . predniSONE (DELTASONE) 10 MG tablet 40 mg x 3 days, 20 mg x 3 days, 10 mg x 3 days 21 tablet 0  . triamcinolone cream (KENALOG) 0.1 % Apply topically 2 (two) times daily. 30 g 3   No current facility-administered medications on file prior to visit.    There were no vitals taken for this visit.      Objective:   Physical Exam Vitals and nursing note reviewed.  Constitutional:      General: He is not in acute distress.    Appearance: Normal appearance. He is well-developed and normal weight.  HENT:     Head: Normocephalic and atraumatic.     Right Ear: Tympanic membrane, ear canal and external ear normal. There is no impacted cerumen.     Left Ear: Tympanic membrane, ear canal and external ear normal. There is no impacted cerumen.     Nose: Nose normal. No congestion or  rhinorrhea.     Mouth/Throat:     Mouth: Mucous membranes are moist.     Pharynx: Oropharynx is clear. No oropharyngeal exudate or posterior oropharyngeal erythema.  Eyes:     General:        Right eye: No discharge.        Left eye: No discharge.     Extraocular Movements: Extraocular movements intact.     Conjunctiva/sclera: Conjunctivae normal.     Pupils:  Pupils are equal, round, and reactive to light.  Neck:     Vascular: No carotid bruit.     Trachea: No tracheal deviation.  Cardiovascular:     Rate and Rhythm: Normal rate and regular rhythm.     Pulses: Normal pulses.     Heart sounds: Normal heart sounds. No murmur. No friction rub. No gallop.   Pulmonary:     Effort: Pulmonary effort is normal. No respiratory distress.     Breath sounds: Normal breath sounds. No stridor. No wheezing, rhonchi or rales.  Chest:     Chest wall: No tenderness.  Abdominal:     General: Bowel sounds are normal. There is no distension.     Palpations: Abdomen is soft. There is no mass.     Tenderness: There is no abdominal tenderness. There is no right CVA tenderness, left CVA tenderness, guarding or rebound.     Hernia: No hernia is present.  Musculoskeletal:        General: No swelling, tenderness, deformity or signs of injury. Normal range of motion.     Right lower leg: No edema.     Left lower leg: No edema.  Lymphadenopathy:     Cervical: No cervical adenopathy.  Skin:    General: Skin is warm and dry.     Capillary Refill: Capillary refill takes less than 2 seconds.     Coloration: Skin is not jaundiced or pale.     Findings: No bruising, erythema, lesion or rash.  Neurological:     General: No focal deficit present.     Mental Status: He is alert and oriented to person, place, and time.     Cranial Nerves: No cranial nerve deficit.     Sensory: No sensory deficit.     Motor: No weakness.     Coordination: Coordination normal.     Gait: Gait normal.     Deep Tendon Reflexes:  Reflexes normal.  Psychiatric:        Mood and Affect: Mood normal.        Behavior: Behavior normal.        Thought Content: Thought content normal.        Judgment: Judgment normal.       Assessment & Plan:  1. Routine general medical examination at a health care facility - Continue to eat healthy and stay active  - Follow up in one year or sooner if needed - CBC with Differential/Platelet - Comprehensive metabolic panel - Lipid panel - TSH  2. Essential hypertension - BP better controlled at home - Continue with current medication - CBC with Differential/Platelet - Comprehensive metabolic panel - Lipid panel - TSH  3. History of asthma - Follow up with Allergy and Asthma   4. Prostate cancer screening - Urology will check PSA   Shirline Frees, NP

## 2019-06-08 NOTE — Patient Instructions (Signed)
It was great seeing you today!   Your exam was excellent. Keep up the heart healthy diet and staying active   We will follow up with you once your labs are back   Please return to the office in one year or sooner if needed

## 2019-06-09 ENCOUNTER — Telehealth: Payer: Self-pay | Admitting: Adult Health

## 2019-06-09 NOTE — Telephone Encounter (Signed)
Noted  

## 2019-06-09 NOTE — Telephone Encounter (Signed)
Pt was returning [place] call. Pt needs a call back at [phone]

## 2019-06-10 ENCOUNTER — Other Ambulatory Visit: Payer: Self-pay | Admitting: Family Medicine

## 2019-06-10 MED ORDER — SIMVASTATIN 20 MG PO TABS: 20.0000 mg | ORAL_TABLET | Freq: Every day | ORAL | 3 refills | Status: DC

## 2019-06-10 NOTE — Telephone Encounter (Signed)
Sent to the pharmacy by e-scribe. 

## 2019-07-03 ENCOUNTER — Other Ambulatory Visit: Payer: Self-pay | Admitting: Adult Health

## 2019-07-03 DIAGNOSIS — I10 Essential (primary) hypertension: Secondary | ICD-10-CM

## 2019-07-06 NOTE — Telephone Encounter (Signed)
SENT TO THE PHARMACY BY E-SCRIBE. 

## 2019-07-07 ENCOUNTER — Encounter: Payer: Self-pay | Admitting: Family Medicine

## 2019-09-02 ENCOUNTER — Other Ambulatory Visit: Payer: Self-pay

## 2019-09-02 ENCOUNTER — Ambulatory Visit: Payer: Medicare PPO | Admitting: Podiatry

## 2019-09-02 ENCOUNTER — Encounter: Payer: Self-pay | Admitting: Podiatry

## 2019-09-02 VITALS — Temp 98.1°F

## 2019-09-02 DIAGNOSIS — L84 Corns and callosities: Secondary | ICD-10-CM

## 2019-09-02 DIAGNOSIS — M722 Plantar fascial fibromatosis: Secondary | ICD-10-CM

## 2019-09-06 NOTE — Progress Notes (Signed)
Subjective:   Patient ID: Chad Nephew Sr., male   DOB: 74 y.o.   MRN: [id number]   HPI Patient presents stating I am getting pain on the bottom of my left heel and its been very sore and also I have a lesion   ROS      Objective:  Physical Exam  Neurovascular status intact with patient having been seen 3 years ago with inflammation of the plantar fascial left and also keratotic lesion which is formed plantar left arch     Assessment:  Acute plantar fasciitis left with porokeratotic type lesion     Plan:  Reviewed condition and x-ray and today I did sterile prep injected the fascia 3 mg Kenalog 5 mg Xylocaine and debrided the lesion left with no iatrogenic bleeding.  Reappoint for routine care as needed

## 2019-12-29 ENCOUNTER — Telehealth: Payer: Self-pay | Admitting: Adult Health

## 2019-12-29 NOTE — Telephone Encounter (Signed)
Left message for patient to call back and schedule Medicare Annual Wellness Visit (AWV) either virtually or in office.  Last AWV 07/05/14; please schedule at anytime with [facility] Nurse Health Advisor 2.  This should be a 45 minute visit.

## 2020-01-13 ENCOUNTER — Other Ambulatory Visit: Payer: Medicare PPO

## 2020-01-13 DIAGNOSIS — Z20822 Contact with and (suspected) exposure to covid-19: Secondary | ICD-10-CM

## 2020-01-14 LAB — NOVEL CORONAVIRUS, NAA: SARS-CoV-2, NAA: NOT DETECTED

## 2020-01-14 LAB — SARS-COV-2, NAA 2 DAY TAT

## 2020-05-15 NOTE — Progress Notes (Signed)
Subjective:   Chad KNEBEL Sr. is a 75 y.o. male who presents for Medicare Annual/Subsequent preventive examination.  I connected with Roslynn Amble  today by telephone and verified that I am speaking with the correct person using two identifiers. Location patient: home Location provider: work Persons participating in the virtual visit: patient, provider.   I discussed the limitations, risks, security and privacy concerns of performing an evaluation and management service by telephone and the availability of in person appointments. I also discussed with the patient that there may be a patient responsible charge related to this service. The patient expressed understanding and verbally consented to this telephonic visit.    Interactive audio and video telecommunications were attempted between this provider and patient, however failed, due to patient having technical difficulties OR patient did not have access to video capability.  We continued and completed visit with audio only.      Review of Systems    N/A  Cardiac Risk Factors include: advanced age (>42men, >85 women);hypertension;male gender     Objective:    There were no vitals filed for this visit. There is no height or weight on file to calculate BMI.  Advanced Directives 05/16/2020  Does Patient Have a Medical Advance Directive? No  Does patient want to make changes to medical advance directive? No - Patient declined    Current Medications (verified) Outpatient Encounter Medications as of 05/16/2020  Medication Sig  . ibuprofen (ADVIL) 200 MG tablet Take 200 mg by mouth every 6 (six) hours as needed.  Marland Kitchen ipratropium (ATROVENT) 0.03 % nasal spray Place 2 sprays into both nostrils 3 (three) times daily as needed.  . latanoprost (XALATAN) 0.005 % ophthalmic solution   . losartan (COZAAR) 25 MG tablet TAKE 1 TABLET BY MOUTH TWICE A DAY  . Multiple Vitamins-Minerals (MENS 50+ ADVANCED PO) Take 1 tablet by mouth daily.  Marland Kitchen  triamcinolone cream (KENALOG) 0.1 % Apply topically 2 (two) times daily.  Marland Kitchen albuterol (PROVENTIL HFA;VENTOLIN HFA) 108 (90 Base) MCG/ACT inhaler Inhale 2 puffs into the lungs every 6 (six) hours as needed for wheezing. (Patient not taking: Reported on 05/16/2020)  . desloratadine (CLARINEX) 5 MG tablet  (Patient not taking: Reported on 05/16/2020)   No facility-administered encounter medications on file as of 05/16/2020.    Allergies (verified) Codeine, Statins, and Sulfonamide derivatives   History: Past Medical History:  Diagnosis Date  . AR (allergic rhinitis)   . Bilateral club feet    surgery and slinting as child  . BPH (benign prostatic hyperplasia)   . FUNGAL DERMATITIS 11/30/2009  . HYPERTENSION 01/28/2007  . Kidney stone    06/2009  . PILONIDAL CYST, WITH ABSCESS 08/06/2007   Past Surgical History:  Procedure Laterality Date  . CLUB FOOT RELEASE     bilat - as a child   Family History  Problem Relation Age of Onset  . Cancer Neg Hx        colon ca  . Hypertension Neg Hx        familyl hx   Social History   Socioeconomic History  . Marital status: Married    Spouse name: Not on file  . Number of children: Not on file  . Years of education: Not on file  . Highest education level: Not on file  Occupational History  . Not on file  Tobacco Use  . Smoking status: Never Smoker  . Smokeless tobacco: Never Used  Substance and Sexual Activity  . Alcohol use:  Yes    Comment: rarley   . Drug use: Not on file  . Sexual activity: Not on file  Other Topics Concern  . Not on file  Social History Narrative  . Not on file   Social Determinants of Health   Financial Resource Strain: Low Risk   . Difficulty of Paying Living Expenses: Not hard at all  Food Insecurity: No Food Insecurity  . Worried About Programme researcher, broadcasting/film/videounning Out of Food in the Last Year: Never true  . Ran Out of Food in the Last Year: Never true  Transportation Needs: No Transportation Needs  . Lack of  Transportation (Medical): No  . Lack of Transportation (Non-Medical): No  Physical Activity: Sufficiently Active  . Days of Exercise per Week: 7 days  . Minutes of Exercise per Session: 40 min  Stress: No Stress Concern Present  . Feeling of Stress : Not at all  Social Connections: Moderately Isolated  . Frequency of Communication with Friends and Family: More than three times a week  . Frequency of Social Gatherings with Friends and Family: Once a week  . Attends Religious Services: Never  . Active Member of Clubs or Organizations: No  . Attends BankerClub or Organization Meetings: Never  . Marital Status: Married    Tobacco Counseling Counseling given: Not Answered   Clinical Intake:  Pre-visit preparation completed: Yes  Pain : No/denies pain     Nutritional Risks: None Diabetes: No  How often do you need to have someone help you when you read instructions, pamphlets, or other written materials from your doctor or pharmacy?: 1 - Never What is the last grade level you completed in school?: BS  Diabetic?No   Interpreter Needed?: No  Information entered by :: SCrews,LPN   Activities of Daily Living In your present state of health, do you have any difficulty performing the following activities: 05/16/2020  Hearing? N  Vision? N  Difficulty concentrating or making decisions? N  Walking or climbing stairs? N  Dressing or bathing? N  Doing errands, shopping? N  Preparing Food and eating ? N  Using the Toilet? N  In the past six months, have you accidently leaked urine? N  Do you have problems with loss of bowel control? N  Managing your Medications? N  Managing your Finances? N  Housekeeping or managing your Housekeeping? N  Some recent data might be hidden    Patient Care Team: Shirline FreesNafziger, Cory, NP as PCP - General (Family Medicine)  Indicate any recent Medical Services you may have received from other than [facility] providers in the past year (date may be  approximate).     Assessment:   This is a routine wellness examination for Chad HuaDavid.  Hearing/Vision screen  Hearing Screening   125Hz  250Hz  500Hz  1000Hz  2000Hz  3000Hz  4000Hz  6000Hz  8000Hz   Right ear:           Left ear:           Vision Screening Comments: Gets eye exams twice a year due to increase pressure. Wears glasses   Dietary issues and exercise activities discussed: Current Exercise Habits: Home exercise routine, Type of exercise: walking, Time (Minutes): 30, Frequency (Times/Week): 7, Weekly Exercise (Minutes/Week): 210, Intensity: Mild  Goals    . Patient Stated     [facility]       Depression Screen PHQ 2/9 Scores 05/16/2020 06/08/2019 01/09/2016 07/05/2014 05/20/2013  PHQ - 2 Score 0 0 0 0 0  Fall Risk Fall Risk  05/16/2020 06/08/2019 01/09/2016 07/05/2014 05/20/2013  Falls in the past year? 0 0 No No No  Number falls in past yr: 0 - - - -  Injury with Fall? 0 - - - -  Risk for fall due to : No Fall Risks - - - -  Follow up Falls evaluation completed;Falls prevention discussed - - - -    FALL RISK PREVENTION PERTAINING TO THE HOME:  Any stairs in or around the home? Yes  If so, are there any without handrails? No  Home free of loose throw rugs in walkways, pet beds, electrical cords, etc? Yes  Adequate lighting in your home to reduce risk of falls? Yes   ASSISTIVE DEVICES UTILIZED TO PREVENT FALLS:  Life alert? No  Use of a cane, walker or w/c? No  Grab bars in the bathroom? No  Shower chair or bench in shower? No  Elevated toilet seat or a handicapped toilet? No    Cognitive Function:   Normal cognitive status assessed by direct observation by this Nurse Health Advisor. No abnormalities found.        Immunizations Immunization History  Administered Date(s) Administered  . PFIZER(Purple Top)SARS-COV-2 Vaccination 03/22/2019, 04/12/2019  . Pneumococcal Conjugate-13 03/10/2017  . Pneumococcal Polysaccharide-23 12/27/2010  . Td  06/19/2009    TDAP status: Due, Education has been provided regarding the importance of this vaccine. Advised may receive this vaccine at local pharmacy or Health Dept. Aware to provide a copy of the vaccination record if obtained from local pharmacy or Health Dept. Verbalized acceptance and understanding.  Flu Vaccine status: Declined, Education has been provided regarding the importance of this vaccine but patient still declined. Advised may receive this vaccine at local pharmacy or Health Dept. Aware to provide a copy of the vaccination record if obtained from local pharmacy or Health Dept. Verbalized acceptance and understanding.  Pneumococcal vaccine status: Up to date  Covid-19 vaccine status: Completed vaccines  Qualifies for Shingles Vaccine? Yes   Zostavax completed No   Shingrix Completed?: No.    Education has been provided regarding the importance of this vaccine. Patient has been advised to call insurance company to determine out of pocket expense if they have not yet received this vaccine. Advised may also receive vaccine at local pharmacy or Health Dept. Verbalized acceptance and understanding.  Screening Tests Health Maintenance  Topic Date Due  . Hepatitis C Screening  Never done  . INFLUENZA VACCINE  Never done  . COVID-19 Vaccine (3 - Booster for Pfizer series) 10/10/2019  . COLONOSCOPY (Pts 45-72yrs Insurance coverage will need to be confirmed)  12/25/2020  . PNA vac Low Risk Adult  Completed  . HPV VACCINES  Aged Out    Health Maintenance  Health Maintenance Due  Topic Date Due  . Hepatitis C Screening  Never done  . INFLUENZA VACCINE  Never done  . COVID-19 Vaccine (3 - Booster for Pfizer series) 10/10/2019    Colorectal cancer screening: Type of screening: Colonoscopy. Completed 12/26/2010. Repeat every 10 years  Lung Cancer Screening: (Low Dose CT Chest recommended if Age 35-80 years, 30 pack-year currently smoking OR have quit w/in 15years.) does not  qualify.   Lung Cancer Screening Referral: N/A   Additional Screening:  Hepatitis C Screening: does qualify;   Vision Screening: Recommended annual ophthalmology exams for early detection of glaucoma and other disorders of the eye. Is the patient up to date with their annual eye exam?  Yes  Who  is the provider or what is the name of the office in which the patient attends annual eye exams? Dr. Sherrine Maples  If pt is not established with a provider, would they like to be referred to a provider to establish care? No .   Dental Screening: Recommended annual dental exams for proper oral hygiene  Community Resource Referral / Chronic Care Management: CRR required this visit?  No   CCM required this visit?  No      Plan:     I have personally reviewed and noted the following in the patient's chart:   . Medical and social history . Use of alcohol, tobacco or illicit drugs  . Current medications and supplements . Functional ability and status . Nutritional status . Physical activity . Advanced directives . List of other physicians . Hospitalizations, surgeries, and ER visits in previous 12 months . Vitals . Screenings to include cognitive, depression, and falls . Referrals and appointments  In addition, I have reviewed and discussed with patient certain preventive protocols, quality metrics, and best practice recommendations. A written personalized care plan for preventive services as well as general preventive health recommendations were provided to patient.     Theodora Blow, LPN   5/40/0867   Nurse Notes: None

## 2020-05-16 ENCOUNTER — Ambulatory Visit (INDEPENDENT_AMBULATORY_CARE_PROVIDER_SITE_OTHER): Payer: Medicare PPO

## 2020-05-16 ENCOUNTER — Other Ambulatory Visit: Payer: Self-pay

## 2020-05-16 DIAGNOSIS — Z0000 Encounter for general adult medical examination without abnormal findings: Secondary | ICD-10-CM

## 2020-05-16 NOTE — Patient Instructions (Signed)
Chad Yoder , Thank you for taking time to come for your Medicare Wellness Visit. I appreciate your ongoing commitment to your health goals. Please review the following plan we discussed and let me know if I can assist you in the future.   Screening recommendations/referrals: Colonoscopy: Up to date,  Next due 12/25/2020 Recommended yearly ophthalmology/optometry visit for glaucoma screening and checkup Recommended yearly dental visit for hygiene and checkup  Vaccinations: Influenza vaccine: Patient declined  Pneumococcal vaccine: Completed Series  Tdap vaccine: Currently due, you may await on injury to receive Shingles vaccine: Currently due for Shingrix, if you would like to receive recommend that you do so at your local pharmacy.    Advanced directives: Advance directive discussed with you today. Even though you declined this today please call our office should you change your mind and we can give you the proper paperwork for you to fill out.   Conditions/risks identified: None   Next appointment: None   Preventive Care 65 Years and Older, Male Preventive care refers to lifestyle choices and visits with your health care provider that can promote health and wellness. What does preventive care include?  A yearly physical exam. This is also called an annual well check.  Dental exams once or twice a year.  Routine eye exams. Ask your health care provider how often you should have your eyes checked.  Personal lifestyle choices, including:  Daily care of your teeth and gums.  Regular physical activity.  Eating a healthy diet.  Avoiding tobacco and drug use.  Limiting alcohol use.  Practicing safe sex.  Taking low doses of aspirin every day.  Taking vitamin and mineral supplements as recommended by your health care provider. What happens during an annual well check? The services and screenings done by your health care provider during your annual well check will depend on  your age, overall health, lifestyle risk factors, and family history of disease. Counseling  Your health care provider may ask you questions about your:  Alcohol use.  Tobacco use.  Drug use.  Emotional well-being.  Home and relationship well-being.  Sexual activity.  Eating habits.  History of falls.  Memory and ability to understand (cognition).  Work and work Astronomer. Screening  You may have the following tests or measurements:  Height, weight, and BMI.  Blood pressure.  Lipid and cholesterol levels. These may be checked every 5 years, or more frequently if you are over 67 years old.  Skin check.  Lung cancer screening. You may have this screening every year starting at age 39 if you have a 30-pack-year history of smoking and currently smoke or have quit within the past 15 years.  Fecal occult blood test (FOBT) of the stool. You may have this test every year starting at age 52.  Flexible sigmoidoscopy or colonoscopy. You may have a sigmoidoscopy every 5 years or a colonoscopy every 10 years starting at age 81.  Prostate cancer screening. Recommendations will vary depending on your family history and other risks.  Hepatitis C blood test.  Hepatitis B blood test.  Sexually transmitted disease (STD) testing.  Diabetes screening. This is done by checking your blood sugar (glucose) after you have not eaten for a while (fasting). You may have this done every 1-3 years.  Abdominal aortic aneurysm (AAA) screening. You may need this if you are a current or former smoker.  Osteoporosis. You may be screened starting at age 30 if you are at high risk. Talk with your health  care provider about your test results, treatment options, and if necessary, the need for more tests. Vaccines  Your health care provider may recommend certain vaccines, such as:  Influenza vaccine. This is recommended every year.  Tetanus, diphtheria, and acellular pertussis (Tdap, Td) vaccine.  You may need a Td booster every 10 years.  Zoster vaccine. You may need this after age 36.  Pneumococcal 13-valent conjugate (PCV13) vaccine. One dose is recommended after age 61.  Pneumococcal polysaccharide (PPSV23) vaccine. One dose is recommended after age 1. Talk to your health care provider about which screenings and vaccines you need and how often you need them. This information is not intended to replace advice given to you by your health care provider. Make sure you discuss any questions you have with your health care provider. Document Released: 03/10/2015 Document Revised: 11/01/2015 Document Reviewed: 12/13/2014 Elsevier Interactive Patient Education  2017 [place] Prevention in the Home Falls can cause injuries. They can happen to people of all ages. There are many things you can do to make your home safe and to help prevent falls. What can I do on the outside of my home?  Regularly fix the edges of walkways and driveways and fix any cracks.  Remove anything that might make you trip as you walk through a door, such as a raised step or threshold.  Trim any bushes or trees on the path to your home.  Use bright outdoor lighting.  Clear any walking paths of anything that might make someone trip, such as rocks or tools.  Regularly check to see if handrails are loose or broken. Make sure that both sides of any steps have handrails.  Any raised decks and porches should have guardrails on the edges.  Have any leaves, snow, or ice cleared regularly.  Use sand or salt on walking paths during winter.  Clean up any spills in your garage right away. This includes oil or grease spills. What can I do in the bathroom?  Use night lights.  Install grab bars by the toilet and in the tub and shower. Do not use towel bars as grab bars.  Use non-skid mats or decals in the tub or shower.  If you need to sit down in the shower, use a plastic, non-slip stool.  Keep the  floor dry. Clean up any water that spills on the floor as soon as it happens.  Remove soap buildup in the tub or shower regularly.  Attach bath mats securely with double-sided non-slip rug tape.  Do not have throw rugs and other things on the floor that can make you trip. What can I do in the bedroom?  Use night lights.  Make sure that you have a light by your bed that is easy to reach.  Do not use any sheets or blankets that are too big for your bed. They should not hang down onto the floor.  Have a firm chair that has side arms. You can use this for support while you get dressed.  Do not have throw rugs and other things on the floor that can make you trip. What can I do in the kitchen?  Clean up any spills right away.  Avoid walking on wet floors.  Keep items that you use a lot in easy-to-reach places.  If you need to reach something above you, use a strong step stool that has a grab bar.  Keep electrical cords out of the way.  Do not use floor  polish or wax that makes floors slippery. If you must use wax, use non-skid floor wax.  Do not have throw rugs and other things on the floor that can make you trip. What can I do with my stairs?  Do not leave any items on the stairs.  Make sure that there are handrails on both sides of the stairs and use them. Fix handrails that are broken or loose. Make sure that handrails are as long as the stairways.  Check any carpeting to make sure that it is firmly attached to the stairs. Fix any carpet that is loose or worn.  Avoid having throw rugs at the top or bottom of the stairs. If you do have throw rugs, attach them to the floor with carpet tape.  Make sure that you have a light switch at the top of the stairs and the bottom of the stairs. If you do not have them, ask someone to add them for you. What else can I do to help prevent falls?  Wear shoes that:  Do not have high heels.  Have rubber bottoms.  Are comfortable and fit  you well.  Are closed at the toe. Do not wear sandals.  If you use a stepladder:  Make sure that it is fully opened. Do not climb a closed stepladder.  Make sure that both sides of the stepladder are locked into place.  Ask someone to hold it for you, if possible.  Clearly mark and make sure that you can see:  Any grab bars or handrails.  First and last steps.  Where the edge of each step is.  Use tools that help you move around (mobility aids) if they are needed. These include:  Canes.  Walkers.  Scooters.  Crutches.  Turn on the lights when you go into a dark area. Replace any light bulbs as soon as they burn out.  Set up your furniture so you have a clear path. Avoid moving your furniture around.  If any of your floors are uneven, fix them.  If there are any pets around you, be aware of where they are.  Review your medicines with your doctor. Some medicines can make you feel dizzy. This can increase your chance of falling. Ask your doctor what other things that you can do to help prevent falls. This information is not intended to replace advice given to you by your health care provider. Make sure you discuss any questions you have with your health care provider. Document Released: 12/08/2008 Document Revised: 07/20/2015 Document Reviewed: 03/18/2014 Elsevier Interactive Patient Education  2017 [employer].

## 2020-06-21 DIAGNOSIS — L218 Other seborrheic dermatitis: Secondary | ICD-10-CM | POA: Diagnosis not present

## 2020-06-21 DIAGNOSIS — Z85828 Personal history of other malignant neoplasm of skin: Secondary | ICD-10-CM | POA: Diagnosis not present

## 2020-06-21 DIAGNOSIS — D1721 Benign lipomatous neoplasm of skin and subcutaneous tissue of right arm: Secondary | ICD-10-CM | POA: Diagnosis not present

## 2020-06-21 DIAGNOSIS — D1801 Hemangioma of skin and subcutaneous tissue: Secondary | ICD-10-CM | POA: Diagnosis not present

## 2020-06-21 DIAGNOSIS — L821 Other seborrheic keratosis: Secondary | ICD-10-CM | POA: Diagnosis not present

## 2020-06-21 DIAGNOSIS — L570 Actinic keratosis: Secondary | ICD-10-CM | POA: Diagnosis not present

## 2020-08-02 ENCOUNTER — Other Ambulatory Visit: Payer: Self-pay

## 2020-08-02 ENCOUNTER — Encounter: Payer: Self-pay | Admitting: Adult Health

## 2020-08-02 ENCOUNTER — Ambulatory Visit (INDEPENDENT_AMBULATORY_CARE_PROVIDER_SITE_OTHER): Payer: Medicare PPO | Admitting: Adult Health

## 2020-08-02 VITALS — BP 180/80 | HR 103 | Temp 98.1°F | Ht 68.0 in | Wt 170.0 lb

## 2020-08-02 DIAGNOSIS — N400 Enlarged prostate without lower urinary tract symptoms: Secondary | ICD-10-CM | POA: Diagnosis not present

## 2020-08-02 DIAGNOSIS — Z1159 Encounter for screening for other viral diseases: Secondary | ICD-10-CM | POA: Diagnosis not present

## 2020-08-02 DIAGNOSIS — I10 Essential (primary) hypertension: Secondary | ICD-10-CM

## 2020-08-02 DIAGNOSIS — Z8709 Personal history of other diseases of the respiratory system: Secondary | ICD-10-CM | POA: Diagnosis not present

## 2020-08-02 DIAGNOSIS — Z0000 Encounter for general adult medical examination without abnormal findings: Secondary | ICD-10-CM

## 2020-08-02 DIAGNOSIS — E785 Hyperlipidemia, unspecified: Secondary | ICD-10-CM

## 2020-08-02 LAB — COMPREHENSIVE METABOLIC PANEL
ALT: 15 U/L (ref 0–53)
AST: 17 U/L (ref 0–37)
Albumin: 4.5 g/dL (ref 3.5–5.2)
Alkaline Phosphatase: 79 U/L (ref 39–117)
BUN: 17 mg/dL (ref 6–23)
CO2: 27 mEq/L (ref 19–32)
Calcium: 9.4 mg/dL (ref 8.4–10.5)
Chloride: 103 mEq/L (ref 96–112)
Creatinine, Ser: 1.01 mg/dL (ref 0.40–1.50)
GFR: 73.2 mL/min (ref >60.00)
Glucose, Bld: 105 mg/dL — ABNORMAL HIGH (ref 70–99)
Potassium: 4.2 mEq/L (ref 3.5–5.1)
Sodium: 138 mEq/L (ref 135–145)
Total Bilirubin: 1.1 mg/dL (ref 0.2–1.2)
Total Protein: 6.8 g/dL (ref 6.0–8.3)

## 2020-08-02 LAB — CBC WITH DIFFERENTIAL/PLATELET
Basophils Absolute: 0.1 10*3/uL (ref 0.0–0.1)
Basophils Relative: 0.7 % (ref 0.0–3.0)
Eosinophils Absolute: 0.2 10*3/uL (ref 0.0–0.7)
Eosinophils Relative: 2.1 % (ref 0.0–5.0)
HCT: 45.3 % (ref 39.0–52.0)
Hemoglobin: 15.3 g/dL (ref 13.0–17.0)
Lymphocytes Relative: 15.5 % (ref 12.0–46.0)
Lymphs Abs: 1.3 10*3/uL (ref 0.7–4.0)
MCHC: 33.7 g/dL (ref 30.0–36.0)
MCV: 86.5 fl (ref 78.0–100.0)
Monocytes Absolute: 0.6 10*3/uL (ref 0.1–1.0)
Monocytes Relative: 6.9 % (ref 3.0–12.0)
Neutro Abs: 6.4 10*3/uL (ref 1.4–7.7)
Neutrophils Relative %: 74.8 % (ref 43.0–77.0)
Platelets: 239 10*3/uL (ref 150.0–400.0)
RBC: 5.24 Mil/uL (ref 4.22–5.81)
RDW: 13.4 % (ref 11.5–15.5)
WBC: 8.6 10*3/uL (ref 4.0–10.5)

## 2020-08-02 LAB — LIPID PANEL
Cholesterol: 221 mg/dL — ABNORMAL HIGH (ref 0–200)
HDL: 47.4 mg/dL (ref >39.00)
LDL Cholesterol: 152 mg/dL — ABNORMAL HIGH (ref 0–99)
NonHDL: 173.42
Total CHOL/HDL Ratio: 5
Triglycerides: 106 mg/dL (ref 0.0–149.0)
VLDL: 21.2 mg/dL (ref 0.0–40.0)

## 2020-08-02 LAB — PSA: PSA: 1.17 ng/mL (ref 0.10–4.00)

## 2020-08-02 LAB — TSH: TSH: 2.47 u[IU]/mL (ref 0.35–4.50)

## 2020-08-02 MED ORDER — PREDNISONE 10 MG PO TABS: ORAL_TABLET | ORAL | 0 refills | Status: DC

## 2020-08-02 MED ORDER — LOSARTAN POTASSIUM 25 MG PO TABS: 25.0000 mg | ORAL_TABLET | Freq: Two times a day (BID) | ORAL | 3 refills | Status: DC

## 2020-08-02 MED ORDER — CEPHALEXIN 500 MG PO CAPS: 500.0000 mg | ORAL_CAPSULE | Freq: Three times a day (TID) | ORAL | 0 refills | Status: AC

## 2020-08-02 MED ORDER — ALBUTEROL SULFATE HFA 108 (90 BASE) MCG/ACT IN AERS: 2.0000 | INHALATION_SPRAY | Freq: Four times a day (QID) | RESPIRATORY_TRACT | 2 refills | Status: DC | PRN

## 2020-08-02 NOTE — Progress Notes (Signed)
Subjective:    Patient ID: Chad Yoder., male    DOB: March 08, 1945, 75 y.o.   MRN: [id number]  HPI Patient presents for yearly preventative medicine examination. He is a pleasant 75 year old male who  has a past medical history of AR (allergic rhinitis), Bilateral club feet, BPH (benign prostatic hyperplasia), FUNGAL DERMATITIS (11/30/2009), HYPERTENSION (01/28/2007), Kidney stone, and PILONIDAL CYST, WITH ABSCESS (08/06/2007).  Essential Hypertension -prescribed Cozaar 25 mg twice daily.  He does monitor his blood pressures at home periodically and reports readings in the 130s to 140s over 70/80s.  He denies chest pain, shortness of breath, dizziness, lightheadedness, or blurred vision BP Readings from Last 3 Encounters:  08/02/20 (!) 180/80  06/08/19 (!) 146/80  03/10/17 135/80   Asthma-mild intermittent.  Uses an albuterol inhaler as needed.  Is seen by [facility] and asthma as needed  BPH -is seen by [facility] in the past. Not on any medication currently.   Hyperlipidemia - Has tried multiple statins in the past and has had diarrhea from each of them.  Lab Results  Component Value Date   CHOL 203 (H) 06/08/2019   HDL 40.20 06/08/2019   LDLCALC 146 (H) 06/08/2019   LDLDIRECT 188.9 02/12/2012   TRIG 84.0 06/08/2019   CHOLHDL 5 06/08/2019     Lab Results  Component Value Date   CHOL 203 (H) 06/08/2019   HDL 40.20 06/08/2019   LDLCALC 146 (H) 06/08/2019   LDLDIRECT 188.9 02/12/2012   TRIG 84.0 06/08/2019   CHOLHDL 5 06/08/2019     All immunizations and health maintenance protocols were reviewed with the patient and needed orders were placed.  Appropriate screening laboratory values were ordered for the patient including screening of hyperlipidemia, renal function and hepatic function. If indicated by BPH, a PSA was ordered.  Medication reconciliation,  past medical history, social history, problem list and allergies were reviewed in detail with the  patient  Goals were established with regard to weight loss, exercise, and  diet in compliance with medications  He is up-to-date on routine colon cancer screening ( due in Oct 2012), vision exams and dermatology appointments.    Review of Systems  Constitutional: Negative.   HENT: Negative.   Eyes: Negative.   Respiratory: Negative.   Cardiovascular: Negative.   Gastrointestinal: Negative.   Endocrine: Negative.   Genitourinary: Negative.   Musculoskeletal: Negative.   Skin: Negative.   Allergic/Immunologic: Negative.   Neurological: Negative.   Hematological: Negative.   Psychiatric/Behavioral: Negative.   All other systems reviewed and are negative.  Past Medical History:  Diagnosis Date  . AR (allergic rhinitis)   . Bilateral club feet    surgery and slinting as child  . BPH (benign prostatic hyperplasia)   . FUNGAL DERMATITIS 11/30/2009  . HYPERTENSION 01/28/2007  . Kidney stone    06/2009  . PILONIDAL CYST, WITH ABSCESS 08/06/2007    Social History   Socioeconomic History  . Marital status: Married    Spouse name: Not on file  . Number of children: Not on file  . Years of education: Not on file  . Highest education level: Not on file  Occupational History  . Not on file  Tobacco Use  . Smoking status: Never Smoker  . Smokeless tobacco: Never Used  Substance and Sexual Activity  . Alcohol use: Yes    Comment: rarley   . Drug use: Not on file  . Sexual activity: Not on file  Other Topics  Concern  . Not on file  Social History Narrative  . Not on file   Social Determinants of Health   Financial Resource Strain: Low Risk   . Difficulty of Paying Living Expenses: Not hard at all  Food Insecurity: No Food Insecurity  . Worried About Programme researcher, broadcasting/film/video in the Last Year: Never true  . Ran Out of Food in the Last Year: Never true  Transportation Needs: No Transportation Needs  . Lack of Transportation (Medical): No  . Lack of Transportation (Non-Medical):  No  Physical Activity: Sufficiently Active  . Days of Exercise per Week: 7 days  . Minutes of Exercise per Session: 40 min  Stress: No Stress Concern Present  . Feeling of Stress : Not at all  Social Connections: Moderately Isolated  . Frequency of Communication with Friends and Family: More than three times a week  . Frequency of Social Gatherings with Friends and Family: Once a week  . Attends Religious Services: Never  . Active Member of Clubs or Organizations: No  . Attends Banker Meetings: Never  . Marital Status: Married  Catering manager Violence: Not At Risk  . Fear of Current or Ex-Partner: No  . Emotionally Abused: No  . Physically Abused: No  . Sexually Abused: No    Past Surgical History:  Procedure Laterality Date  . CLUB FOOT RELEASE     bilat - as a child    Family History  Problem Relation Age of Onset  . Cancer Neg Hx        colon ca  . Hypertension Neg Hx        familyl hx    Allergies  Allergen Reactions  . Codeine     REACTION: N\T\ V  . Statins Diarrhea    Pt stated, "this gave me uncontrollable bowels"  . Sulfonamide Derivatives     REACTION: rash    Current Outpatient Medications on File Prior to Visit  Medication Sig Dispense Refill  . albuterol (PROVENTIL HFA;VENTOLIN HFA) 108 (90 Base) MCG/ACT inhaler Inhale 2 puffs into the lungs every 6 (six) hours as needed for wheezing. (Patient not taking: Reported on 05/16/2020) 1 Inhaler 2  . desloratadine (CLARINEX) 5 MG tablet  (Patient not taking: Reported on 05/16/2020)    . ibuprofen (ADVIL) 200 MG tablet Take 200 mg by mouth every 6 (six) hours as needed.    Marland Kitchen ipratropium (ATROVENT) 0.03 % nasal spray Place 2 sprays into both nostrils 3 (three) times daily as needed.    . latanoprost (XALATAN) 0.005 % ophthalmic solution     . losartan (COZAAR) 25 MG tablet TAKE 1 TABLET BY MOUTH TWICE A DAY 180 tablet 3  . Multiple Vitamins-Minerals (MENS 50+ ADVANCED PO) Take 1 tablet by mouth  daily.    Marland Kitchen triamcinolone cream (KENALOG) 0.1 % Apply topically 2 (two) times daily. 30 g 3   No current facility-administered medications on file prior to visit.    There were no vitals taken for this visit.      Objective:   Physical Exam Vitals and nursing note reviewed.  Constitutional:      General: He is not in acute distress.    Appearance: Normal appearance. He is well-developed and normal weight.  HENT:     Head: Normocephalic and atraumatic.     Right Ear: Tympanic membrane, ear canal and external ear normal. There is no impacted cerumen.     Left Ear: Tympanic membrane, ear canal and external  ear normal. There is no impacted cerumen.     Nose: Nose normal. No congestion or rhinorrhea.     Mouth/Throat:     Mouth: Mucous membranes are moist.     Pharynx: Oropharynx is clear. No oropharyngeal exudate or posterior oropharyngeal erythema.  Eyes:     General:        Right eye: No discharge.        Left eye: No discharge.     Extraocular Movements: Extraocular movements intact.     Conjunctiva/sclera: Conjunctivae normal.     Pupils: Pupils are equal, round, and reactive to light.  Neck:     Vascular: No carotid bruit.     Trachea: No tracheal deviation.  Cardiovascular:     Rate and Rhythm: Normal rate and regular rhythm.     Pulses: Normal pulses.     Heart sounds: Normal heart sounds. No murmur heard. No friction rub. No gallop.   Pulmonary:     Effort: Pulmonary effort is normal. No respiratory distress.     Breath sounds: Normal breath sounds. No stridor. No wheezing, rhonchi or rales.  Chest:     Chest wall: No tenderness.  Abdominal:     General: Bowel sounds are normal. There is no distension.     Palpations: Abdomen is soft. There is no mass.     Tenderness: There is no abdominal tenderness. There is no right CVA tenderness, left CVA tenderness, guarding or rebound.     Hernia: No hernia is present.  Musculoskeletal:        General: No swelling,  tenderness, deformity or signs of injury. Normal range of motion.     Right lower leg: No edema.     Left lower leg: No edema.  Lymphadenopathy:     Cervical: No cervical adenopathy.  Skin:    General: Skin is warm and dry.     Capillary Refill: Capillary refill takes less than 2 seconds.     Coloration: Skin is not jaundiced or pale.     Findings: No bruising, erythema, lesion or rash.  Neurological:     General: No focal deficit present.     Mental Status: He is alert and oriented to person, place, and time.     Cranial Nerves: No cranial nerve deficit.     Sensory: No sensory deficit.     Motor: No weakness.     Coordination: Coordination normal.     Gait: Gait normal.     Deep Tendon Reflexes: Reflexes normal.  Psychiatric:        Mood and Affect: Mood normal.        Behavior: Behavior normal.        Thought Content: Thought content normal.        Judgment: Judgment normal.       Assessment & Plan:  1. Routine general medical examination at a health care facility - Follow up in one year or sooner if needed - Continue to exercise and eat healthy  - CBC with Differential/Platelet; Future - Comprehensive metabolic panel; Future - Lipid panel; Future - TSH; Future  2. Essential hypertension - Better controlled at home.  - CBC with Differential/Platelet; Future - Comprehensive metabolic panel; Future - Lipid panel; Future - TSH; Future - losartan (COZAAR) 25 MG tablet; Take 1 tablet (25 mg total) by mouth 2 (two) times daily.  Dispense: 180 tablet; Refill: 3  3. History of asthma  - albuterol (VENTOLIN HFA) 108 (90 Base) MCG/ACT inhaler; Inhale  2 puffs into the lungs every 6 (six) hours as needed for wheezing.  Dispense: 1 each; Refill: 2  4. Benign prostatic hyperplasia without lower urinary tract symptoms  - PSA; Future  5. Need for hepatitis C screening test  - Hep C Antibody; Future  6. Hyperlipidemia, unspecified hyperlipidemia type - Consider Zetia - CBC  with Differential/Platelet; Future - Comprehensive metabolic panel; Future - Lipid panel; Future - TSH; Future  Shirline Frees, NP

## 2020-08-02 NOTE — Patient Instructions (Addendum)
It was great seeing you today   I have also sent in your blood pressure medication   We will follow up with you regarding your blood work   I will see you back in one year or sooner if needed

## 2020-08-03 ENCOUNTER — Other Ambulatory Visit: Payer: Self-pay

## 2020-08-03 LAB — HEPATITIS C ANTIBODY
Hepatitis C Ab: NONREACTIVE
SIGNAL TO CUT-OFF: 0 (ref <1.00)

## 2020-08-03 MED ORDER — EZETIMIBE 10 MG PO TABS: 10.0000 mg | ORAL_TABLET | Freq: Every day | ORAL | 3 refills | Status: DC

## 2020-08-09 DIAGNOSIS — Z961 Presence of intraocular lens: Secondary | ICD-10-CM | POA: Diagnosis not present

## 2020-08-09 DIAGNOSIS — H40053 Ocular hypertension, bilateral: Secondary | ICD-10-CM | POA: Diagnosis not present

## 2020-08-09 DIAGNOSIS — H43813 Vitreous degeneration, bilateral: Secondary | ICD-10-CM | POA: Diagnosis not present

## 2020-08-09 DIAGNOSIS — H31092 Other chorioretinal scars, left eye: Secondary | ICD-10-CM | POA: Diagnosis not present

## 2021-01-08 ENCOUNTER — Ambulatory Visit (INDEPENDENT_AMBULATORY_CARE_PROVIDER_SITE_OTHER): Payer: Medicare PPO

## 2021-01-08 ENCOUNTER — Ambulatory Visit: Payer: Medicare PPO | Admitting: Podiatry

## 2021-01-08 ENCOUNTER — Encounter: Payer: Self-pay | Admitting: Podiatry

## 2021-01-08 ENCOUNTER — Other Ambulatory Visit: Payer: Self-pay

## 2021-01-08 DIAGNOSIS — L84 Corns and callosities: Secondary | ICD-10-CM | POA: Diagnosis not present

## 2021-01-08 NOTE — Progress Notes (Signed)
Subjective:   Patient ID: Chad Nephew Sr., male   DOB: 75 y.o.   MRN: [id number]   HPI Patient presents with 2 painful lesions bottom the left foot states everything else is doing pretty well but just started back again the last couple months neuro   ROS      Objective:  Physical Exam  Vascular status intact with patient found to have 2 lesions that are very tender when pressed     Assessment:  Chronic porokeratotic symptomatic lesions plantar left     Plan:  Debridement corn callus formation no iatrogenic bleeding reappoint routine care  X-rays dated today were negative for signs of there is any advanced arthritis or other indications of bony pathology with the symptoms the patient is exhibiting

## 2021-02-23 DIAGNOSIS — N401 Enlarged prostate with lower urinary tract symptoms: Secondary | ICD-10-CM | POA: Diagnosis not present

## 2021-02-23 DIAGNOSIS — R3912 Poor urinary stream: Secondary | ICD-10-CM | POA: Diagnosis not present

## 2021-02-23 DIAGNOSIS — R3911 Hesitancy of micturition: Secondary | ICD-10-CM | POA: Diagnosis not present

## 2021-02-28 DIAGNOSIS — H40053 Ocular hypertension, bilateral: Secondary | ICD-10-CM | POA: Diagnosis not present

## 2021-02-28 DIAGNOSIS — H43813 Vitreous degeneration, bilateral: Secondary | ICD-10-CM | POA: Diagnosis not present

## 2021-02-28 DIAGNOSIS — Z961 Presence of intraocular lens: Secondary | ICD-10-CM | POA: Diagnosis not present

## 2021-02-28 DIAGNOSIS — H31092 Other chorioretinal scars, left eye: Secondary | ICD-10-CM | POA: Diagnosis not present

## 2021-03-26 DIAGNOSIS — R3912 Poor urinary stream: Secondary | ICD-10-CM | POA: Diagnosis not present

## 2021-03-26 DIAGNOSIS — N481 Balanitis: Secondary | ICD-10-CM | POA: Diagnosis not present

## 2021-04-17 ENCOUNTER — Telehealth: Payer: Self-pay

## 2021-04-17 NOTE — Telephone Encounter (Signed)
Unsuccessful attempt to contact patient on preferred number listed in notes also on alternate number given by office/patient. Left message on preferred number okay to reschedule. Alternate number was out of service

## 2021-04-19 ENCOUNTER — Encounter: Payer: Self-pay | Admitting: Podiatry

## 2021-04-19 ENCOUNTER — Other Ambulatory Visit: Payer: Self-pay

## 2021-04-19 ENCOUNTER — Ambulatory Visit (INDEPENDENT_AMBULATORY_CARE_PROVIDER_SITE_OTHER): Payer: Medicare PPO | Admitting: Podiatry

## 2021-04-19 DIAGNOSIS — L84 Corns and callosities: Secondary | ICD-10-CM | POA: Diagnosis not present

## 2021-04-20 NOTE — Progress Notes (Signed)
Subjective:   Patient ID: Chad Nephew Sr., male   DOB: 76 y.o.   MRN: [id number]   HPI Patient has chronic callus x2 left which is painful again and did well for several months   ROS      Objective:  Physical Exam  Neuro vascular status intact with keratotic lesion x2 plantar aspect left foot painful when pressed     Assessment:  Chronic keratotic lesion x2 left     Plan:  Sterile sharp debridement of lesions accomplished no iatrogenic bleeding reappoint routine care as needed with patient to wear thick bottom shoes

## 2021-05-10 DIAGNOSIS — J3089 Other allergic rhinitis: Secondary | ICD-10-CM | POA: Diagnosis not present

## 2021-05-10 DIAGNOSIS — J3081 Allergic rhinitis due to animal (cat) (dog) hair and dander: Secondary | ICD-10-CM | POA: Diagnosis not present

## 2021-05-10 DIAGNOSIS — J301 Allergic rhinitis due to pollen: Secondary | ICD-10-CM | POA: Diagnosis not present

## 2021-05-10 DIAGNOSIS — R062 Wheezing: Secondary | ICD-10-CM | POA: Diagnosis not present

## 2021-05-17 ENCOUNTER — Telehealth: Payer: Self-pay | Admitting: Adult Health

## 2021-05-17 NOTE — Telephone Encounter (Signed)
Spoke with patient to schedule Medicare Annual Wellness Visit (AWV) either virtually or in office. ? ?Patient declined only wanted to see PCP ? ?Last AWV 05/16/20 ? ?

## 2021-06-26 DIAGNOSIS — D0462 Carcinoma in situ of skin of left upper limb, including shoulder: Secondary | ICD-10-CM | POA: Diagnosis not present

## 2021-06-26 DIAGNOSIS — Z85828 Personal history of other malignant neoplasm of skin: Secondary | ICD-10-CM | POA: Diagnosis not present

## 2021-06-26 DIAGNOSIS — L821 Other seborrheic keratosis: Secondary | ICD-10-CM | POA: Diagnosis not present

## 2021-06-26 DIAGNOSIS — D485 Neoplasm of uncertain behavior of skin: Secondary | ICD-10-CM | POA: Diagnosis not present

## 2021-06-26 DIAGNOSIS — L570 Actinic keratosis: Secondary | ICD-10-CM | POA: Diagnosis not present

## 2021-06-26 DIAGNOSIS — D1801 Hemangioma of skin and subcutaneous tissue: Secondary | ICD-10-CM | POA: Diagnosis not present

## 2021-06-26 DIAGNOSIS — L218 Other seborrheic dermatitis: Secondary | ICD-10-CM | POA: Diagnosis not present

## 2021-08-02 ENCOUNTER — Other Ambulatory Visit: Payer: Self-pay | Admitting: Adult Health

## 2021-08-02 DIAGNOSIS — I10 Essential (primary) hypertension: Secondary | ICD-10-CM

## 2021-08-06 DIAGNOSIS — N202 Calculus of kidney with calculus of ureter: Secondary | ICD-10-CM | POA: Diagnosis not present

## 2021-08-06 DIAGNOSIS — N481 Balanitis: Secondary | ICD-10-CM | POA: Diagnosis not present

## 2021-08-06 DIAGNOSIS — R3911 Hesitancy of micturition: Secondary | ICD-10-CM | POA: Diagnosis not present

## 2021-08-07 ENCOUNTER — Encounter: Payer: Self-pay | Admitting: Adult Health

## 2021-08-07 ENCOUNTER — Ambulatory Visit (INDEPENDENT_AMBULATORY_CARE_PROVIDER_SITE_OTHER): Payer: Medicare PPO | Admitting: Adult Health

## 2021-08-07 VITALS — BP 140/82 | HR 117 | Temp 98.5°F | Ht 68.0 in | Wt 167.0 lb

## 2021-08-07 DIAGNOSIS — E785 Hyperlipidemia, unspecified: Secondary | ICD-10-CM | POA: Diagnosis not present

## 2021-08-07 DIAGNOSIS — Z8709 Personal history of other diseases of the respiratory system: Secondary | ICD-10-CM

## 2021-08-07 DIAGNOSIS — N400 Enlarged prostate without lower urinary tract symptoms: Secondary | ICD-10-CM | POA: Diagnosis not present

## 2021-08-07 DIAGNOSIS — Z0000 Encounter for general adult medical examination without abnormal findings: Secondary | ICD-10-CM

## 2021-08-07 DIAGNOSIS — I10 Essential (primary) hypertension: Secondary | ICD-10-CM | POA: Diagnosis not present

## 2021-08-07 LAB — COMPREHENSIVE METABOLIC PANEL
ALT: 19 U/L (ref 0–53)
AST: 20 U/L (ref 0–37)
Albumin: 4.4 g/dL (ref 3.5–5.2)
Alkaline Phosphatase: 67 U/L (ref 39–117)
BUN: 16 mg/dL (ref 6–23)
CO2: 28 mEq/L (ref 19–32)
Calcium: 9.4 mg/dL (ref 8.4–10.5)
Chloride: 102 mEq/L (ref 96–112)
Creatinine, Ser: 1.1 mg/dL (ref 0.40–1.50)
GFR: 65.6 mL/min (ref >60.00)
Glucose, Bld: 104 mg/dL — ABNORMAL HIGH (ref 70–99)
Potassium: 3.9 mEq/L (ref 3.5–5.1)
Sodium: 137 mEq/L (ref 135–145)
Total Bilirubin: 1.4 mg/dL — ABNORMAL HIGH (ref 0.2–1.2)
Total Protein: 6.7 g/dL (ref 6.0–8.3)

## 2021-08-07 LAB — CBC WITH DIFFERENTIAL/PLATELET
Basophils Absolute: 0 10*3/uL (ref 0.0–0.1)
Basophils Relative: 0.5 % (ref 0.0–3.0)
Eosinophils Absolute: 0.1 10*3/uL (ref 0.0–0.7)
Eosinophils Relative: 1.7 % (ref 0.0–5.0)
HCT: 43 % (ref 39.0–52.0)
Hemoglobin: 14.8 g/dL (ref 13.0–17.0)
Lymphocytes Relative: 19.5 % (ref 12.0–46.0)
Lymphs Abs: 1.4 10*3/uL (ref 0.7–4.0)
MCHC: 34.3 g/dL (ref 30.0–36.0)
MCV: 86.8 fl (ref 78.0–100.0)
Monocytes Absolute: 0.5 10*3/uL (ref 0.1–1.0)
Monocytes Relative: 7.8 % (ref 3.0–12.0)
Neutro Abs: 4.9 10*3/uL (ref 1.4–7.7)
Neutrophils Relative %: 70.5 % (ref 43.0–77.0)
Platelets: 223 10*3/uL (ref 150.0–400.0)
RBC: 4.95 Mil/uL (ref 4.22–5.81)
RDW: 13.2 % (ref 11.5–15.5)
WBC: 7 10*3/uL (ref 4.0–10.5)

## 2021-08-07 LAB — HEMOGLOBIN A1C: Hgb A1c MFr Bld: 5.2 % (ref 4.6–6.5)

## 2021-08-07 LAB — TSH: TSH: 1.87 u[IU]/mL (ref 0.35–5.50)

## 2021-08-07 LAB — LIPID PANEL
Cholesterol: 181 mg/dL (ref 0–200)
HDL: 49.7 mg/dL (ref >39.00)
LDL Cholesterol: 117 mg/dL — ABNORMAL HIGH (ref 0–99)
NonHDL: 131.33
Total CHOL/HDL Ratio: 4
Triglycerides: 72 mg/dL (ref 0.0–149.0)
VLDL: 14.4 mg/dL (ref 0.0–40.0)

## 2021-08-07 NOTE — Patient Instructions (Signed)
It was great seeing you today   We will follow up with you regarding your lab work   Please let me know if you need anything   

## 2021-08-07 NOTE — Progress Notes (Signed)
Subjective:    Patient ID: Chad Yoder., male    DOB: 02-Aug-1945, 76 y.o.   MRN: [id number]  HPI Patient presents for yearly preventative medicine examination. He is a pleasant 76 year old male who  has a past medical history of AR (allergic rhinitis), Bilateral club feet, BPH (benign prostatic hyperplasia), FUNGAL DERMATITIS (11/30/2009), HYPERTENSION (01/28/2007), Kidney stone, and PILONIDAL CYST, WITH ABSCESS (08/06/2007).  Hypertension-managed with Cozaar 25 mg twice daily.  He does monitor his blood pressure at home periodically and reports readings in the 130s to 140s over 70s to 80s.  He denies chest pain, shortness of breath, dizziness, lightheadedness, or blurred vision BP Readings from Last 3 Encounters:  08/07/21 140/82  08/02/20 (!) 180/80  06/08/19 (!) 146/80    Asthma-mild/intermittent.  Uses an albuterol inhaler as needed.  He is seen by [facility]  BPH-managed by [facility].  Prescribed finasteride 5 mg.He was recently seen by Urology and they want to perform a TURP  Hyperlipidemia-has been statin intolerant to multiple statins.  He had diarrhea from each one of them.  Currently prescribed Zetia 10 mg daily. He reports that he stopped taking the Zetia after two months because it was causing him to have " heavy breathing". This resolved within 5 days or stopping the medication and he started taking it 1-2 times a week without issues.  Lab Results  Component Value Date   CHOL 221 (H) 08/02/2020   HDL 47.40 08/02/2020   LDLCALC 152 (H) 08/02/2020   LDLDIRECT 188.9 02/12/2012   TRIG 106.0 08/02/2020   CHOLHDL 5 08/02/2020   All immunizations and health maintenance protocols were reviewed with the patient and needed orders were placed.  Appropriate screening laboratory values were ordered for the patient including screening of hyperlipidemia, renal function and hepatic function. If indicated by BPH, a PSA was ordered.  Medication  reconciliation,  past medical history, social history, problem list and allergies were reviewed in detail with the patient  Goals were established with regard to weight loss, exercise, and  diet in compliance with medications  Wt Readings from Last 3 Encounters:  08/07/21 167 lb (75.8 kg)  08/02/20 170 lb (77.1 kg)  06/08/19 169 lb (76.7 kg)   Refuses repeat colonoscopy   Review of Systems  Constitutional: Negative.   HENT: Negative.    Eyes: Negative.   Respiratory: Negative.    Cardiovascular: Negative.   Gastrointestinal: Negative.   Endocrine: Negative.   Genitourinary: Negative.   Musculoskeletal: Negative.   Skin: Negative.   Allergic/Immunologic: Negative.   Neurological: Negative.   Hematological: Negative.   Psychiatric/Behavioral: Negative.    All other systems reviewed and are negative.  Past Medical History:  Diagnosis Date   AR (allergic rhinitis)    Bilateral club feet    surgery and slinting as child   BPH (benign prostatic hyperplasia)    FUNGAL DERMATITIS 11/30/2009   HYPERTENSION 01/28/2007   Kidney stone    06/2009   PILONIDAL CYST, WITH ABSCESS 08/06/2007    Social History   Socioeconomic History   Marital status: Married    Spouse name: Not on file   Number of children: Not on file   Years of education: Not on file   Highest education level: Not on file  Occupational History   Not on file  Tobacco Use   Smoking status: Never   Smokeless tobacco: Never  Substance and Sexual Activity   Alcohol use: Yes  Comment: rarley    Drug use: Not on file   Sexual activity: Not on file  Other Topics Concern   Not on file  Social History Narrative   Not on file   Social Determinants of Health   Financial Resource Strain: Low Risk  (05/16/2020)   Overall Financial Resource Strain (CARDIA)    Difficulty of Paying Living Expenses: Not hard at all  Food Insecurity: No Food Insecurity (05/16/2020)   Hunger Vital Sign    Worried About Running Out of  Food in the Last Year: Never true    Ran Out of Food in the Last Year: Never true  Transportation Needs: No Transportation Needs (05/16/2020)   PRAPARE - Administrator, Civil ServiceTransportation    Lack of Transportation (Medical): No    Lack of Transportation (Non-Medical): No  Physical Activity: Sufficiently Active (05/16/2020)   Exercise Vital Sign    Days of Exercise per Week: 7 days    Minutes of Exercise per Session: 40 min  Stress: No Stress Concern Present (05/16/2020)   [employer] - Occupational Stress Questionnaire    Feeling of Stress : Not at all  Social Connections: Moderately Isolated (05/16/2020)   Social Connection and Isolation Panel [NHANES]    Frequency of Communication with Friends and Family: More than three times a week    Frequency of Social Gatherings with Friends and Family: Once a week    Attends Religious Services: Never    Database administratorActive Member of Clubs or Organizations: No    Attends BankerClub or Organization Meetings: Never    Marital Status: Married  Catering managerntimate Partner Violence: Not At Risk (05/16/2020)   Humiliation, Afraid, Rape, and Kick questionnaire    Fear of Current or Ex-Partner: No    Emotionally Abused: No    Physically Abused: No    Sexually Abused: No    Past Surgical History:  Procedure Laterality Date   CLUB FOOT RELEASE     bilat - as a child    Family History  Problem Relation Age of Onset   Cancer Neg Hx        colon ca   Hypertension Neg Hx        familyl hx    Allergies  Allergen Reactions   Codeine     REACTION: N\T\ V   Influenza Virus Vaccine Other (See Comments)   Statins Diarrhea    Pt stated, "this gave me uncontrollable bowels"   Sulfonamide Derivatives     REACTION: rash    Current Outpatient Medications on File Prior to Visit  Medication Sig Dispense Refill   CLARITIN 10 MG tablet      desloratadine (CLARINEX) 5 MG tablet      ezetimibe (ZETIA) 10 MG tablet Take 1 tablet (10 mg total) by mouth daily. 90 tablet 3    ibuprofen (ADVIL) 200 MG tablet Take 200 mg by mouth every 6 (six) hours as needed.     ipratropium (ATROVENT) 0.03 % nasal spray Place 2 sprays into both nostrils 3 (three) times daily as needed.     latanoprost (XALATAN) 0.005 % ophthalmic solution      losartan (COZAAR) 25 MG tablet TAKE 1 TABLET BY MOUTH TWICE A DAY 180 tablet 0   Multiple Vitamins-Minerals (MENS 50+ ADVANCED PO) Take 1 tablet by mouth daily.     triamcinolone cream (KENALOG) 0.1 % Apply topically 2 (two) times daily. 30 g 3   albuterol (VENTOLIN HFA) 108 (90 Base) MCG/ACT inhaler Inhale 2 puffs into  the lungs every 6 (six) hours as needed for wheezing. (Patient not taking: Reported on 08/07/2021) 1 each 2   finasteride (PROSCAR) 5 MG tablet Take 5 mg by mouth daily.     No current facility-administered medications on file prior to visit.    BP 140/82 Comment: home  Pulse (!) 117   Temp 98.5 F (36.9 C) (Oral)   Ht 5\' 8"  (1.727 m)   Wt 167 lb (75.8 kg)   SpO2 97%   BMI 25.39 kg/m       Objective:   Physical Exam Vitals and nursing note reviewed.  Constitutional:      General: He is not in acute distress.    Appearance: Normal appearance. He is well-developed and normal weight.  HENT:     Head: Normocephalic and atraumatic.     Right Ear: Tympanic membrane, ear canal and external ear normal. There is no impacted cerumen.     Left Ear: Tympanic membrane, ear canal and external ear normal. There is no impacted cerumen.     Nose: Nose normal. No congestion or rhinorrhea.     Mouth/Throat:     Mouth: Mucous membranes are moist.     Pharynx: Oropharynx is clear. No oropharyngeal exudate or posterior oropharyngeal erythema.  Eyes:     General:        Right eye: No discharge.        Left eye: No discharge.     Extraocular Movements: Extraocular movements intact.     Conjunctiva/sclera: Conjunctivae normal.     Pupils: Pupils are equal, round, and reactive to light.  Neck:     Vascular: No carotid bruit.      Trachea: No tracheal deviation.  Cardiovascular:     Rate and Rhythm: Normal rate and regular rhythm.     Pulses: Normal pulses.     Heart sounds: Normal heart sounds. No murmur heard.    No friction rub. No gallop.  Pulmonary:     Effort: Pulmonary effort is normal. No respiratory distress.     Breath sounds: Normal breath sounds. No stridor. No wheezing, rhonchi or rales.  Chest:     Chest wall: No tenderness.  Abdominal:     General: Bowel sounds are normal. There is no distension.     Palpations: Abdomen is soft. There is no mass.     Tenderness: There is no abdominal tenderness. There is no right CVA tenderness, left CVA tenderness, guarding or rebound.     Hernia: No hernia is present.  Musculoskeletal:        General: No swelling, tenderness, deformity or signs of injury. Normal range of motion.     Right lower leg: No edema.     Left lower leg: No edema.  Lymphadenopathy:     Cervical: No cervical adenopathy.  Skin:    General: Skin is warm and dry.     Capillary Refill: Capillary refill takes less than 2 seconds.     Coloration: Skin is not jaundiced or pale.     Findings: No bruising, erythema, lesion or rash.  Neurological:     General: No focal deficit present.     Mental Status: He is alert and oriented to person, place, and time.     Cranial Nerves: No cranial nerve deficit.     Sensory: No sensory deficit.     Motor: No weakness.     Coordination: Coordination normal.     Gait: Gait normal.     Deep  Tendon Reflexes: Reflexes normal.  Psychiatric:        Mood and Affect: Mood normal.        Behavior: Behavior normal.        Thought Content: Thought content normal.        Judgment: Judgment normal.       Assessment & Plan:  1. Routine general medical examination at a health care facility - Refuses colonoscopy - he understands his risks - Advised to get shingles vaccination at pharmacy  - Follow up in one year or sooner if needed - CBC with  Differential/Platelet; Future - Comprehensive metabolic panel; Future - Hemoglobin A1c; Future - Lipid panel; Future - TSH; Future  2. Essential hypertension - Well controlled.  - No change in medication  - CBC with Differential/Platelet; Future - Comprehensive metabolic panel; Future - Hemoglobin A1c; Future - Lipid panel; Future - TSH; Future  3. History of asthma - No acute flares - Continue with rescue inhaler as needed - CBC with Differential/Platelet; Future - Comprehensive metabolic panel; Future - Hemoglobin A1c; Future - Lipid panel; Future - TSH; Future  4. Benign prostatic hyperplasia without lower urinary tract symptoms - Follow up with Urology as directed  5. Hyperlipidemia, unspecified hyperlipidemia type - Consider referral to lipid clinic  - CBC with Differential/Platelet; Future - Comprehensive metabolic panel; Future - Hemoglobin A1c; Future - Lipid panel; Future - TSH; Future  Shirline Frees, NP

## 2021-08-09 ENCOUNTER — Telehealth: Payer: Self-pay | Admitting: Adult Health

## 2021-08-09 NOTE — Telephone Encounter (Signed)
Pt is calling back and would like blood work results 

## 2021-09-05 DIAGNOSIS — H43813 Vitreous degeneration, bilateral: Secondary | ICD-10-CM | POA: Diagnosis not present

## 2021-09-05 DIAGNOSIS — H40053 Ocular hypertension, bilateral: Secondary | ICD-10-CM | POA: Diagnosis not present

## 2021-09-05 DIAGNOSIS — Z961 Presence of intraocular lens: Secondary | ICD-10-CM | POA: Diagnosis not present

## 2021-09-05 DIAGNOSIS — H31092 Other chorioretinal scars, left eye: Secondary | ICD-10-CM | POA: Diagnosis not present

## 2021-11-02 ENCOUNTER — Emergency Department (HOSPITAL_BASED_OUTPATIENT_CLINIC_OR_DEPARTMENT_OTHER)
Admission: EM | Admit: 2021-11-02 | Discharge: 2021-11-02 | Disposition: A | Discharge status: Home / Self Care | Payer: Medicare PPO | Attending: Emergency Medicine | Admitting: Emergency Medicine

## 2021-11-02 ENCOUNTER — Emergency Department (HOSPITAL_BASED_OUTPATIENT_CLINIC_OR_DEPARTMENT_OTHER): Payer: Medicare PPO

## 2021-11-02 ENCOUNTER — Other Ambulatory Visit: Payer: Self-pay

## 2021-11-02 ENCOUNTER — Encounter (HOSPITAL_BASED_OUTPATIENT_CLINIC_OR_DEPARTMENT_OTHER): Payer: Self-pay | Admitting: Emergency Medicine

## 2021-11-02 DIAGNOSIS — I700 Atherosclerosis of aorta: Secondary | ICD-10-CM | POA: Insufficient documentation

## 2021-11-02 DIAGNOSIS — M5442 Lumbago with sciatica, left side: Secondary | ICD-10-CM | POA: Insufficient documentation

## 2021-11-02 DIAGNOSIS — K8020 Calculus of gallbladder without cholecystitis without obstruction: Secondary | ICD-10-CM | POA: Diagnosis not present

## 2021-11-02 DIAGNOSIS — K5730 Diverticulosis of large intestine without perforation or abscess without bleeding: Secondary | ICD-10-CM | POA: Insufficient documentation

## 2021-11-02 DIAGNOSIS — M545 Low back pain, unspecified: Secondary | ICD-10-CM | POA: Diagnosis present

## 2021-11-02 DIAGNOSIS — M5440 Lumbago with sciatica, unspecified side: Secondary | ICD-10-CM

## 2021-11-02 DIAGNOSIS — N400 Enlarged prostate without lower urinary tract symptoms: Secondary | ICD-10-CM | POA: Insufficient documentation

## 2021-11-02 DIAGNOSIS — M79652 Pain in left thigh: Secondary | ICD-10-CM | POA: Diagnosis not present

## 2021-11-02 DIAGNOSIS — D72829 Elevated white blood cell count, unspecified: Secondary | ICD-10-CM | POA: Insufficient documentation

## 2021-11-02 DIAGNOSIS — M79662 Pain in left lower leg: Secondary | ICD-10-CM | POA: Diagnosis not present

## 2021-11-02 DIAGNOSIS — R109 Unspecified abdominal pain: Secondary | ICD-10-CM | POA: Diagnosis not present

## 2021-11-02 LAB — CBC WITH DIFFERENTIAL/PLATELET
Abs Immature Granulocytes: 0.08 10*3/uL — ABNORMAL HIGH (ref 0.00–0.07)
Basophils Absolute: 0 10*3/uL (ref 0.0–0.1)
Basophils Relative: 0 %
Eosinophils Absolute: 0 10*3/uL (ref 0.0–0.5)
Eosinophils Relative: 0 %
HCT: 40.5 % (ref 39.0–52.0)
Hemoglobin: 13.9 g/dL (ref 13.0–17.0)
Immature Granulocytes: 1 %
Lymphocytes Relative: 5 %
Lymphs Abs: 0.7 10*3/uL (ref 0.7–4.0)
MCH: 29.7 pg (ref 26.0–34.0)
MCHC: 34.3 g/dL (ref 30.0–36.0)
MCV: 86.5 fL (ref 80.0–100.0)
Monocytes Absolute: 1 10*3/uL (ref 0.1–1.0)
Monocytes Relative: 7 %
Neutro Abs: 11.9 10*3/uL — ABNORMAL HIGH (ref 1.7–7.7)
Neutrophils Relative %: 87 %
Platelets: 249 10*3/uL (ref 150–400)
RBC: 4.68 MIL/uL (ref 4.22–5.81)
RDW: 12.5 % (ref 11.5–15.5)
WBC: 13.8 10*3/uL — ABNORMAL HIGH (ref 4.0–10.5)
nRBC: 0 % (ref 0.0–0.2)

## 2021-11-02 LAB — BASIC METABOLIC PANEL
Anion gap: 9 (ref 5–15)
BUN: 23 mg/dL (ref 8–23)
CO2: 25 mmol/L (ref 22–32)
Calcium: 9.2 mg/dL (ref 8.9–10.3)
Chloride: 103 mmol/L (ref 98–111)
Creatinine, Ser: 1.05 mg/dL (ref 0.61–1.24)
GFR, Estimated: >60 mL/min (ref >60)
Glucose, Bld: 137 mg/dL — ABNORMAL HIGH (ref 70–99)
Potassium: 4.4 mmol/L (ref 3.5–5.1)
Sodium: 137 mmol/L (ref 135–145)

## 2021-11-02 LAB — URINALYSIS, ROUTINE W REFLEX MICROSCOPIC
Bilirubin Urine: NEGATIVE
Glucose, UA: NEGATIVE mg/dL
Hgb urine dipstick: NEGATIVE
Ketones, ur: NEGATIVE mg/dL
Leukocytes,Ua: NEGATIVE
Nitrite: NEGATIVE
Specific Gravity, Urine: 1.027 (ref 1.005–1.030)
pH: 6.5 (ref 5.0–8.0)

## 2021-11-02 NOTE — ED Triage Notes (Signed)
Left back pain that runs down his leg  since yesterday , no injury, but he mowed the yard  no numbness or tingling

## 2021-11-02 NOTE — Discharge Instructions (Signed)
You were seen in the emergency department for some left-sided low back pain into your left thigh.  You had blood work urinalysis CAT scan and ultrasound of your leg that did not show any significant findings.  This pain is likely muscular.  You can try Tylenol and ibuprofen, ice or heat to the affected area.  Increase your activity as tolerated.  Follow-up with your primary care doctor.  Return to the emergency department if any worsening or concerning symptoms.

## 2021-11-02 NOTE — ED Provider Notes (Signed)
[facility] EMERGENCY DEPT Provider Note   CSN: [id number] Arrival date & time: 11/02/21  4098     History  Chief Complaint  Patient presents with   Back Pain    Chad Yoder Sr. is a 76 y.o. male.  He is here with a complaint of left lower back pain radiating into his left thigh that started yesterday.  He said it sharp and stabbing.  Worse with ambulation.  He does not recall any trauma but did mow the lawn.  No numbness or weakness.  No abdominal pain no testicular pain no urinary symptoms.  He is concerned he may have a blood clot or a kidney stone.  Has tried nothing for his symptoms.  The history is provided by the patient.  Back Pain Location:  Lumbar spine Quality:  Stabbing Radiates to:  L thigh Onset quality:  Gradual Duration:  2 days Timing:  Intermittent Progression:  Unchanged Chronicity:  New Context: not falling   Relieved by:  None tried Worsened by:  Ambulation Ineffective treatments:  None tried Associated symptoms: leg pain   Associated symptoms: no abdominal pain, no chest pain, no dysuria, no fever, no numbness and no weakness   Risk factors: hx of cancer        Home Medications Prior to Admission medications   Medication Sig Start Date End Date Taking? Authorizing Provider  albuterol (VENTOLIN HFA) 108 (90 Base) MCG/ACT inhaler Inhale 2 puffs into the lungs every 6 (six) hours as needed for wheezing. Patient not taking: Reported on 08/07/2021 08/02/20 08/07/21  Shirline Frees, NP  CLARITIN 10 MG tablet  06/26/21   [provider]  desloratadine (CLARINEX) 5 MG tablet  06/28/19   Eileen Stanford, MD  ezetimibe (ZETIA) 10 MG tablet Take 1 tablet (10 mg total) by mouth daily. 08/03/20   Nafziger, Kandee Keen, NP  finasteride (PROSCAR) 5 MG tablet Take 5 mg by mouth daily. 06/26/21   [provider]  ibuprofen (ADVIL) 200 MG tablet Take 200 mg by mouth every 6 (six) hours as needed.    [provider]  ipratropium (ATROVENT) 0.03  % nasal spray Place 2 sprays into both nostrils 3 (three) times daily as needed. 06/28/19   Eileen Stanford, MD  latanoprost (XALATAN) 0.005 % ophthalmic solution  12/17/19   [provider]  losartan (COZAAR) 25 MG tablet TAKE 1 TABLET BY MOUTH TWICE A DAY 08/02/21   Nafziger, Kandee Keen, NP  Multiple Vitamins-Minerals (MENS 50+ ADVANCED PO) Take 1 tablet by mouth daily.    [provider]  triamcinolone cream (KENALOG) 0.1 % Apply topically 2 (two) times daily. 03/10/17   Roderick Pee, MD      Allergies    Codeine, Influenza virus vaccine, Statins, and Sulfonamide derivatives    Review of Systems   Review of Systems  Constitutional:  Negative for fever.  Cardiovascular:  Negative for chest pain.  Gastrointestinal:  Negative for abdominal pain.  Genitourinary:  Negative for dysuria and testicular pain.  Musculoskeletal:  Positive for back pain.  Neurological:  Negative for weakness and numbness.    Physical Exam Updated Vital Signs BP (!) 146/60 (BP Location: Right Arm)   Pulse 87   Temp 98 F (36.7 C)   Resp 16   Ht 5\' 8"  (1.727 m)   Wt 73.9 kg   SpO2 98%   BMI 24.78 kg/m  Physical Exam Vitals and nursing note reviewed.  Constitutional:      General: He is not in  acute distress.    Appearance: Normal appearance. He is well-developed.  HENT:     Head: Normocephalic and atraumatic.  Eyes:     Conjunctiva/sclera: Conjunctivae normal.  Cardiovascular:     Rate and Rhythm: Normal rate and regular rhythm.     Heart sounds: No murmur heard. Pulmonary:     Effort: Pulmonary effort is normal. No respiratory distress.     Breath sounds: Normal breath sounds.  Abdominal:     Palpations: Abdomen is soft.     Tenderness: There is no abdominal tenderness. There is no guarding or rebound.  Genitourinary:    Testes: Normal.  Musculoskeletal:        General: Tenderness (He has some reproducible left paralumbar tenderness.) present. No deformity.     Cervical back: Neck  supple.     Right lower leg: No edema.     Left lower leg: No edema.  Skin:    General: Skin is warm and dry.     Capillary Refill: Capillary refill takes less than 2 seconds.  Neurological:     General: No focal deficit present.     Mental Status: He is alert.     Sensory: No sensory deficit.     Motor: No weakness.     ED Results / Procedures / Treatments   Labs (all labs ordered are listed, but only abnormal results are displayed) Labs Reviewed  BASIC METABOLIC PANEL - Abnormal; Notable for the following components:      Result Value   Glucose, Bld 137 (*)    All other components within normal limits  CBC WITH DIFFERENTIAL/PLATELET - Abnormal; Notable for the following components:   WBC 13.8 (*)    Neutro Abs 11.9 (*)    Abs Immature Granulocytes 0.08 (*)    All other components within normal limits  URINALYSIS, ROUTINE W REFLEX MICROSCOPIC - Abnormal; Notable for the following components:   Protein, ur TRACE (*)    All other components within normal limits    EKG None  Radiology US Venous Img Lower  Left (DVT Study)  Result Date: 11/02/2021 CLINICAL DATA:  Left lower extremity pain EXAM: LEFT LOWER EXTREMITY VENOUS DOPPLER ULTRASOUND TECHNIQUE: Gray-scale sonography with compression, as well as color and duplex ultrasound, were performed to evaluate the deep venous system(s) from the level of the common femoral vein through the popliteal and proximal calf veins. COMPARISON:  None available FINDINGS: VENOUS Normal compressibility of the common femoral, superficial femoral, and popliteal veins, as well as the visualized calf veins. Visualized portions of profunda femoral vein and great saphenous vein unremarkable. No filling defects to suggest DVT on grayscale or color Doppler imaging. Doppler waveforms show normal direction of venous flow, normal respiratory plasticity and response to augmentation. Limited views of the contralateral common femoral vein are unremarkable. OTHER  None. Limitations: none IMPRESSION: No DVT of the left lower extremity. Electronically Signed   By: Acquanetta Belling M.D.   On: 11/02/2021 13:23   CT Renal Stone Study  Result Date: 11/02/2021 CLINICAL DATA:  Left flank pain EXAM: CT ABDOMEN AND PELVIS WITHOUT CONTRAST TECHNIQUE: Multidetector CT imaging of the abdomen and pelvis was performed following the standard protocol without IV contrast. RADIATION DOSE REDUCTION: This exam was performed according to the departmental dose-optimization program which includes automated exposure control, adjustment of the mA and/or kV according to patient size and/or use of iterative reconstruction technique. COMPARISON:  07/24/2009 FINDINGS: Lower chest: No acute abnormality. Hepatobiliary: Subcentimeter low-density lesion at the right  hepatic dome, too small to characterize. Punctate calcified granuloma within the posterior right hepatic lobe. Liver otherwise within normal limits. Tiny stone within the gallbladder. No pericholecystic inflammatory changes by CT. No biliary dilatation. Pancreas: Unremarkable. No pancreatic ductal dilatation or surrounding inflammatory changes. Spleen: Normal in size without focal abnormality. Adrenals/Urinary Tract: Adrenal glands are unremarkable. Kidneys are normal, without renal calculi, solid lesion, or hydronephrosis. Mild urinary bladder wall thickening. Stomach/Bowel: Stomach is within normal limits. Appendix not definitively seen. Sigmoid diverticulosis. No evidence of bowel wall thickening, distention, or inflammatory changes. Vascular/Lymphatic: Scattered aortoiliac atherosclerotic calcifications without aneurysm. No abdominopelvic lymphadenopathy. Reproductive: Enlarged prostate gland with mass effect upon the base of the urinary bladder. Other: No free fluid. No abdominopelvic fluid collection. No pneumoperitoneum. Small fat containing umbilical hernia. Musculoskeletal: No acute or significant osseous findings. IMPRESSION: 1. No acute  abdominopelvic findings. Specifically, no nephrolithiasis or hydronephrosis. 2. Enlarged prostate gland with mass effect upon the base of the urinary bladder. Mild urinary bladder wall thickening may be related to chronic outlet obstruction. Correlate with urinalysis to exclude cystitis. 3. Sigmoid diverticulosis without evidence of acute diverticulitis. 4. Cholelithiasis. 5. Aortic Atherosclerosis (ICD10-I70.0). Electronically Signed   By: Duanne Guess D.O.   On: 11/02/2021 12:43    Procedures Procedures    Medications Ordered in ED Medications - No data to display  ED Course/ Medical Decision Making/ A&P                           Medical Decision Making Amount and/or Complexity of Data Reviewed Labs: ordered. Radiology: ordered.   This patient complains of left lower back pain radiating to left thigh; this involves an extensive number of treatment Options and is a complaint that carries with it a high risk of complications and morbidity. The differential includes musculoskeletal pain, sciatica, radiculopathy, renal colic, pyelonephritis, UTI, DVT  I ordered, reviewed and interpreted labs, which included CBC with mildly elevated white count normal hemoglobin, chemistries normal other than mildly elevated glucose, urinalysis without signs of infection  I ordered imaging studies which included CT renal, duplex left lower extremity and I independently    visualized and interpreted imaging which showed no acute findings Additional history obtained from patient's wife Previous records obtained and reviewed in epic no recent admissions Cardiac monitoring reviewed, normal sinus rhythm Social determinants considered, no significant barriers Critical Interventions: None  After the interventions stated above, I reevaluated the patient and found patient to be neurovascularly intact Admission and further testing considered, no indications for admission or further work-up at this time.  Will  have patient do symptomatic treatment at home and follow-up with PCP.  Return instructions discussed         Final Clinical Impression(s) / ED Diagnoses Final diagnoses:  Acute left-sided low back pain with sciatica, sciatica laterality unspecified    Rx / DC Orders ED Discharge Orders     None         Terrilee Files, MD 11/02/21 1719

## 2021-11-06 ENCOUNTER — Ambulatory Visit: Payer: Medicare PPO | Admitting: Adult Health

## 2021-11-06 ENCOUNTER — Telehealth: Payer: Self-pay | Admitting: Adult Health

## 2021-11-06 VITALS — BP 130/68 | HR 105 | Temp 98.1°F | Ht 68.0 in | Wt 167.0 lb

## 2021-11-06 DIAGNOSIS — M5442 Lumbago with sciatica, left side: Secondary | ICD-10-CM | POA: Diagnosis not present

## 2021-11-06 MED ORDER — METHYLPREDNISOLONE 4 MG PO TBPK: ORAL_TABLET | ORAL | 0 refills | Status: DC

## 2021-11-06 MED ORDER — CYCLOBENZAPRINE HCL 10 MG PO TABS: 10.0000 mg | ORAL_TABLET | Freq: Three times a day (TID) | ORAL | 0 refills | Status: DC | PRN

## 2021-11-06 NOTE — Progress Notes (Signed)
Subjective:    Patient ID: Chad November., male    DOB: 11/19/1945, 76 y.o.   MRN: [id number]  HPI 76 year old male who  has a past medical history of AR (allergic rhinitis), Bilateral club feet, BPH (benign prostatic hyperplasia), FUNGAL DERMATITIS (11/30/2009), HYPERTENSION (01/28/2007), Kidney stone, and PILONIDAL CYST, WITH ABSCESS (08/06/2007).  He presents to the office today for follow-up after being seen in the emergency room 4 days ago.  He was seen with the complaint of low back pain radiating to his left thigh that started the day prior.  Pain was described as sharp and stabbing, worse with ambulation.  Denied any trauma but did mow the yard.  ER he had ultrasound venous imaging lower left to rule out DVT, this was negative.  He also had a CT renal stone study done which was negative for any acute findings.  He was advised to take Motrin, reports that he tried Motrin 400 mg every 6 hours and this did not help, he then switched to 600 mg every 4-6 hours and did get a little bit of relief but still has the same discomfort.   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   HYPERTENSION 01/28/2007   Kidney stone    06/2009   PILONIDAL CYST, WITH ABSCESS 08/06/2007    Social History   Socioeconomic History   Marital status: Married    Spouse name: Not on file   Number of children: Not on file   Years of education: Not on file   Highest education level: Not on file  Occupational History   Not on file  Tobacco Use   Smoking status: Never   Smokeless tobacco: Never  Vaping Use   Vaping Use: Never used  Substance and Sexual Activity   Alcohol use: Yes    Comment: rarley    Drug use: Never   Sexual activity: Not on file  Other Topics Concern   Not on file  Social History Narrative   Not on file    Social Determinants of Health   Financial Resource Strain: Low Risk  (05/16/2020)   Overall Financial Resource Strain (CARDIA)    Difficulty of Paying Living Expenses: Not hard at all  Food Insecurity: No Food Insecurity (05/16/2020)   Hunger Vital Sign    Worried About Running Out of Food in the Last Year: Never true    Ran Out of Food in the Last Year: Never true  Transportation Needs: No Transportation Needs (05/16/2020)   PRAPARE - Administrator, Civil Service (Medical): No    Lack of Transportation (Non-Medical): No  Physical Activity: Sufficiently Active (05/16/2020)   Exercise Vital Sign    Days of Exercise per Week: 7 days    Minutes of Exercise per Session: 40 min  Stress: No Stress Concern Present (05/16/2020)   [employer] of Occupational Health - Occupational Stress Questionnaire    Feeling of Stress : Not at all  Social Connections: Moderately Isolated (05/16/2020)   Social Connection and Isolation Panel [NHANES]    Frequency of Communication with Friends and Family: More than three times a week    Frequency of Social Gatherings with Friends and Family: Once a week    Attends Religious Services: Never  Active Member of Clubs or Organizations: No    Attends Archivist Meetings: Never    Marital Status: Married  Human resources officer Violence: Not At Risk (05/16/2020)   Humiliation, Afraid, Rape, and Kick questionnaire    Fear of Current or Ex-Partner: No    Emotionally Abused: No    Physically Abused: No    Sexually Abused: No    Past Surgical History:  Procedure Laterality Date   CLUB FOOT RELEASE     bilat - as a child    Family History  Problem Relation Age of Onset   Cancer Neg Hx        colon ca   Hypertension Neg Hx        familyl hx    Allergies  Allergen Reactions   Codeine     REACTION: N\T\ V   Influenza Virus Vaccine Other (See Comments)   Statins Diarrhea    Pt stated, "this gave me uncontrollable bowels"    Sulfonamide Derivatives     REACTION: rash    Current Outpatient Medications on File Prior to Visit  Medication Sig Dispense Refill   CLARITIN 10 MG tablet      desloratadine (CLARINEX) 5 MG tablet      ezetimibe (ZETIA) 10 MG tablet Take 1 tablet (10 mg total) by mouth daily. 90 tablet 3   finasteride (PROSCAR) 5 MG tablet Take 5 mg by mouth daily.     ibuprofen (ADVIL) 200 MG tablet Take 200 mg by mouth every 6 (six) hours as needed.     ipratropium (ATROVENT) 0.03 % nasal spray Place 2 sprays into both nostrils 3 (three) times daily as needed.     latanoprost (XALATAN) 0.005 % ophthalmic solution      losartan (COZAAR) 25 MG tablet TAKE 1 TABLET BY MOUTH TWICE A DAY 180 tablet 0   Multiple Vitamins-Minerals (MENS 50+ ADVANCED PO) Take 1 tablet by mouth daily.     triamcinolone cream (KENALOG) 0.1 % Apply topically 2 (two) times daily. 30 g 3   No current facility-administered medications on file prior to visit.    BP (!) 160/60   Pulse (!) 105   Temp 98.1 F (36.7 C) (Oral)   Ht 5\' 8"  (1.727 m)   Wt 167 lb (75.8 kg)   SpO2 98%   BMI 25.39 kg/m       Objective:   Physical Exam Vitals and nursing note reviewed.  Constitutional:      Appearance: Normal appearance.  Musculoskeletal:        General: Tenderness (Left lower back and along sciatic nerve) present.  Skin:    Capillary Refill: Capillary refill takes less than 2 seconds.  Neurological:     General: No focal deficit present.     Mental Status: He is alert and oriented to person, place, and time.  Psychiatric:        Mood and Affect: Mood normal.        Behavior: Behavior normal.        Thought Content: Thought content normal.       Assessment & Plan:  1. Acute left-sided low back pain with left-sided sciatica  - methylPREDNISolone (MEDROL DOSEPAK) 4 MG TBPK tablet; Take as directed  Dispense: 21 tablet; Refill: 0 - cyclobenzaprine (FLEXERIL) 10 MG tablet; Take 1 tablet (10 mg total) by mouth 3 (three)  times daily as needed for muscle spasms.  Dispense: 30 tablet; Refill: 0 - Follow up if not resolved by early  next week   Shirline Frees, NP

## 2021-11-06 NOTE — Telephone Encounter (Signed)
Pt seeking clarity on how to take cyclobenzaprine (FLEXERIL) 10 MG tablet. States he was told one thing but the bottle states otherwise

## 2021-11-07 ENCOUNTER — Telehealth: Payer: Self-pay | Admitting: Adult Health

## 2021-11-07 NOTE — Telephone Encounter (Signed)
Pt thought that he needed to take the tablets TID in order for the medication to work. Advised pt that its stating he can take up to 3 times a day but its not needed. Pt verbalized understanding.

## 2021-11-07 NOTE — Telephone Encounter (Signed)
Pt's spouse called to say the directions on the bottle for the following medication cyclobenzaprine (FLEXERIL) 10 MG tablet  Reads as follows:  Take 1 tablet (10 mg total) by mouth 3 (three) times daily as needed for muscle spasms  But Pt says, during the visit yesterday, NP told Pt to take at bedtime, to help him sleep.  Spouse would like clarification on the directions.  Please advise.

## 2021-12-03 DIAGNOSIS — D649 Anemia, unspecified: Secondary | ICD-10-CM | POA: Diagnosis not present

## 2021-12-03 DIAGNOSIS — R7989 Other specified abnormal findings of blood chemistry: Secondary | ICD-10-CM | POA: Diagnosis not present

## 2021-12-03 DIAGNOSIS — R319 Hematuria, unspecified: Secondary | ICD-10-CM | POA: Diagnosis not present

## 2021-12-03 DIAGNOSIS — R109 Unspecified abdominal pain: Secondary | ICD-10-CM | POA: Diagnosis not present

## 2021-12-03 DIAGNOSIS — B999 Unspecified infectious disease: Secondary | ICD-10-CM | POA: Diagnosis not present

## 2021-12-03 DIAGNOSIS — Z452 Encounter for adjustment and management of vascular access device: Secondary | ICD-10-CM | POA: Diagnosis not present

## 2021-12-03 DIAGNOSIS — A4189 Other specified sepsis: Secondary | ICD-10-CM | POA: Diagnosis not present

## 2021-12-03 DIAGNOSIS — I10 Essential (primary) hypertension: Secondary | ICD-10-CM | POA: Diagnosis not present

## 2021-12-03 DIAGNOSIS — I358 Other nonrheumatic aortic valve disorders: Secondary | ICD-10-CM | POA: Diagnosis not present

## 2021-12-03 DIAGNOSIS — N400 Enlarged prostate without lower urinary tract symptoms: Secondary | ICD-10-CM | POA: Diagnosis not present

## 2021-12-03 DIAGNOSIS — R509 Fever, unspecified: Secondary | ICD-10-CM | POA: Diagnosis not present

## 2021-12-03 DIAGNOSIS — R5383 Other fatigue: Secondary | ICD-10-CM | POA: Diagnosis not present

## 2021-12-03 DIAGNOSIS — R000 Tachycardia, unspecified: Secondary | ICD-10-CM | POA: Diagnosis not present

## 2021-12-03 DIAGNOSIS — K122 Cellulitis and abscess of mouth: Secondary | ICD-10-CM | POA: Diagnosis not present

## 2021-12-03 DIAGNOSIS — R7881 Bacteremia: Secondary | ICD-10-CM | POA: Diagnosis not present

## 2021-12-03 DIAGNOSIS — D72829 Elevated white blood cell count, unspecified: Secondary | ICD-10-CM | POA: Diagnosis not present

## 2021-12-03 DIAGNOSIS — M8580 Other specified disorders of bone density and structure, unspecified site: Secondary | ICD-10-CM | POA: Diagnosis not present

## 2021-12-03 DIAGNOSIS — R778 Other specified abnormalities of plasma proteins: Secondary | ICD-10-CM | POA: Diagnosis not present

## 2021-12-03 DIAGNOSIS — R079 Chest pain, unspecified: Secondary | ICD-10-CM | POA: Diagnosis not present

## 2021-12-03 DIAGNOSIS — I38 Endocarditis, valve unspecified: Secondary | ICD-10-CM | POA: Diagnosis not present

## 2021-12-03 DIAGNOSIS — E876 Hypokalemia: Secondary | ICD-10-CM | POA: Diagnosis not present

## 2021-12-03 DIAGNOSIS — I330 Acute and subacute infective endocarditis: Secondary | ICD-10-CM | POA: Diagnosis not present

## 2021-12-03 DIAGNOSIS — A409 Streptococcal sepsis, unspecified: Secondary | ICD-10-CM | POA: Diagnosis not present

## 2021-12-03 DIAGNOSIS — I083 Combined rheumatic disorders of mitral, aortic and tricuspid valves: Secondary | ICD-10-CM | POA: Diagnosis not present

## 2021-12-13 DIAGNOSIS — A409 Streptococcal sepsis, unspecified: Secondary | ICD-10-CM | POA: Diagnosis not present

## 2021-12-13 DIAGNOSIS — N400 Enlarged prostate without lower urinary tract symptoms: Secondary | ICD-10-CM | POA: Diagnosis not present

## 2021-12-13 DIAGNOSIS — I330 Acute and subacute infective endocarditis: Secondary | ICD-10-CM | POA: Diagnosis not present

## 2021-12-13 DIAGNOSIS — I10 Essential (primary) hypertension: Secondary | ICD-10-CM | POA: Diagnosis not present

## 2021-12-14 DIAGNOSIS — D72829 Elevated white blood cell count, unspecified: Secondary | ICD-10-CM | POA: Diagnosis not present

## 2021-12-14 DIAGNOSIS — A409 Streptococcal sepsis, unspecified: Secondary | ICD-10-CM | POA: Diagnosis not present

## 2021-12-14 DIAGNOSIS — Z79899 Other long term (current) drug therapy: Secondary | ICD-10-CM | POA: Diagnosis not present

## 2021-12-14 DIAGNOSIS — I10 Essential (primary) hypertension: Secondary | ICD-10-CM | POA: Diagnosis not present

## 2021-12-14 DIAGNOSIS — L03114 Cellulitis of left upper limb: Secondary | ICD-10-CM | POA: Diagnosis not present

## 2021-12-14 DIAGNOSIS — Z23 Encounter for immunization: Secondary | ICD-10-CM | POA: Diagnosis not present

## 2021-12-14 DIAGNOSIS — R600 Localized edema: Secondary | ICD-10-CM | POA: Diagnosis not present

## 2021-12-14 DIAGNOSIS — I330 Acute and subacute infective endocarditis: Secondary | ICD-10-CM | POA: Diagnosis not present

## 2021-12-14 DIAGNOSIS — Z792 Long term (current) use of antibiotics: Secondary | ICD-10-CM | POA: Diagnosis not present

## 2021-12-14 DIAGNOSIS — R5383 Other fatigue: Secondary | ICD-10-CM | POA: Diagnosis not present

## 2021-12-14 DIAGNOSIS — Z885 Allergy status to narcotic agent status: Secondary | ICD-10-CM | POA: Diagnosis not present

## 2021-12-14 DIAGNOSIS — I358 Other nonrheumatic aortic valve disorders: Secondary | ICD-10-CM | POA: Diagnosis not present

## 2021-12-14 DIAGNOSIS — M8580 Other specified disorders of bone density and structure, unspecified site: Secondary | ICD-10-CM | POA: Diagnosis not present

## 2021-12-15 DIAGNOSIS — I10 Essential (primary) hypertension: Secondary | ICD-10-CM | POA: Diagnosis not present

## 2021-12-15 DIAGNOSIS — I358 Other nonrheumatic aortic valve disorders: Secondary | ICD-10-CM | POA: Diagnosis not present

## 2021-12-15 DIAGNOSIS — I330 Acute and subacute infective endocarditis: Secondary | ICD-10-CM | POA: Diagnosis not present

## 2021-12-15 DIAGNOSIS — A409 Streptococcal sepsis, unspecified: Secondary | ICD-10-CM | POA: Diagnosis not present

## 2021-12-15 DIAGNOSIS — Z792 Long term (current) use of antibiotics: Secondary | ICD-10-CM | POA: Diagnosis not present

## 2021-12-15 DIAGNOSIS — M8580 Other specified disorders of bone density and structure, unspecified site: Secondary | ICD-10-CM | POA: Diagnosis not present

## 2021-12-15 DIAGNOSIS — Z885 Allergy status to narcotic agent status: Secondary | ICD-10-CM | POA: Diagnosis not present

## 2021-12-15 DIAGNOSIS — D72829 Elevated white blood cell count, unspecified: Secondary | ICD-10-CM | POA: Diagnosis not present

## 2021-12-15 DIAGNOSIS — Z23 Encounter for immunization: Secondary | ICD-10-CM | POA: Diagnosis not present

## 2021-12-15 DIAGNOSIS — L03114 Cellulitis of left upper limb: Secondary | ICD-10-CM | POA: Diagnosis not present

## 2021-12-15 DIAGNOSIS — Z79899 Other long term (current) drug therapy: Secondary | ICD-10-CM | POA: Diagnosis not present

## 2021-12-16 DIAGNOSIS — Z792 Long term (current) use of antibiotics: Secondary | ICD-10-CM | POA: Diagnosis not present

## 2021-12-16 DIAGNOSIS — D72829 Elevated white blood cell count, unspecified: Secondary | ICD-10-CM | POA: Diagnosis not present

## 2021-12-16 DIAGNOSIS — I10 Essential (primary) hypertension: Secondary | ICD-10-CM | POA: Diagnosis not present

## 2021-12-16 DIAGNOSIS — Z79899 Other long term (current) drug therapy: Secondary | ICD-10-CM | POA: Diagnosis not present

## 2021-12-16 DIAGNOSIS — A409 Streptococcal sepsis, unspecified: Secondary | ICD-10-CM | POA: Diagnosis not present

## 2021-12-16 DIAGNOSIS — M8580 Other specified disorders of bone density and structure, unspecified site: Secondary | ICD-10-CM | POA: Diagnosis not present

## 2021-12-16 DIAGNOSIS — I330 Acute and subacute infective endocarditis: Secondary | ICD-10-CM | POA: Diagnosis not present

## 2021-12-16 DIAGNOSIS — L03114 Cellulitis of left upper limb: Secondary | ICD-10-CM | POA: Diagnosis not present

## 2021-12-16 DIAGNOSIS — Z885 Allergy status to narcotic agent status: Secondary | ICD-10-CM | POA: Diagnosis not present

## 2021-12-16 DIAGNOSIS — Z23 Encounter for immunization: Secondary | ICD-10-CM | POA: Diagnosis not present

## 2021-12-16 DIAGNOSIS — I358 Other nonrheumatic aortic valve disorders: Secondary | ICD-10-CM | POA: Diagnosis not present

## 2021-12-17 DIAGNOSIS — I330 Acute and subacute infective endocarditis: Secondary | ICD-10-CM | POA: Diagnosis not present

## 2021-12-17 DIAGNOSIS — Z885 Allergy status to narcotic agent status: Secondary | ICD-10-CM | POA: Diagnosis not present

## 2021-12-17 DIAGNOSIS — D72829 Elevated white blood cell count, unspecified: Secondary | ICD-10-CM | POA: Diagnosis not present

## 2021-12-17 DIAGNOSIS — L03114 Cellulitis of left upper limb: Secondary | ICD-10-CM | POA: Diagnosis not present

## 2021-12-17 DIAGNOSIS — Z23 Encounter for immunization: Secondary | ICD-10-CM | POA: Diagnosis not present

## 2021-12-17 DIAGNOSIS — I358 Other nonrheumatic aortic valve disorders: Secondary | ICD-10-CM | POA: Diagnosis not present

## 2021-12-17 DIAGNOSIS — A409 Streptococcal sepsis, unspecified: Secondary | ICD-10-CM | POA: Diagnosis not present

## 2021-12-17 DIAGNOSIS — M8580 Other specified disorders of bone density and structure, unspecified site: Secondary | ICD-10-CM | POA: Diagnosis not present

## 2021-12-17 DIAGNOSIS — Z79899 Other long term (current) drug therapy: Secondary | ICD-10-CM | POA: Diagnosis not present

## 2021-12-17 DIAGNOSIS — Z792 Long term (current) use of antibiotics: Secondary | ICD-10-CM | POA: Diagnosis not present

## 2021-12-17 DIAGNOSIS — I10 Essential (primary) hypertension: Secondary | ICD-10-CM | POA: Diagnosis not present

## 2021-12-18 DIAGNOSIS — R7989 Other specified abnormal findings of blood chemistry: Secondary | ICD-10-CM | POA: Diagnosis not present

## 2021-12-18 DIAGNOSIS — I358 Other nonrheumatic aortic valve disorders: Secondary | ICD-10-CM | POA: Diagnosis not present

## 2021-12-24 DIAGNOSIS — I358 Other nonrheumatic aortic valve disorders: Secondary | ICD-10-CM | POA: Diagnosis not present

## 2021-12-24 DIAGNOSIS — R7989 Other specified abnormal findings of blood chemistry: Secondary | ICD-10-CM | POA: Diagnosis not present

## 2021-12-29 DIAGNOSIS — I330 Acute and subacute infective endocarditis: Secondary | ICD-10-CM | POA: Diagnosis not present

## 2021-12-29 DIAGNOSIS — I10 Essential (primary) hypertension: Secondary | ICD-10-CM | POA: Diagnosis not present

## 2021-12-29 DIAGNOSIS — A409 Streptococcal sepsis, unspecified: Secondary | ICD-10-CM | POA: Diagnosis not present

## 2021-12-29 DIAGNOSIS — N400 Enlarged prostate without lower urinary tract symptoms: Secondary | ICD-10-CM | POA: Diagnosis not present

## 2021-12-31 DIAGNOSIS — R7989 Other specified abnormal findings of blood chemistry: Secondary | ICD-10-CM | POA: Diagnosis not present

## 2021-12-31 DIAGNOSIS — I358 Other nonrheumatic aortic valve disorders: Secondary | ICD-10-CM | POA: Diagnosis not present

## 2022-01-02 DIAGNOSIS — I160 Hypertensive urgency: Secondary | ICD-10-CM | POA: Diagnosis not present

## 2022-01-02 DIAGNOSIS — I10 Essential (primary) hypertension: Secondary | ICD-10-CM | POA: Diagnosis not present

## 2022-01-04 DIAGNOSIS — I10 Essential (primary) hypertension: Secondary | ICD-10-CM | POA: Diagnosis not present

## 2022-01-04 DIAGNOSIS — R5383 Other fatigue: Secondary | ICD-10-CM | POA: Diagnosis not present

## 2022-01-07 DIAGNOSIS — I493 Ventricular premature depolarization: Secondary | ICD-10-CM | POA: Diagnosis not present

## 2022-01-07 DIAGNOSIS — I10 Essential (primary) hypertension: Secondary | ICD-10-CM | POA: Diagnosis not present

## 2022-01-07 DIAGNOSIS — I2510 Atherosclerotic heart disease of native coronary artery without angina pectoris: Secondary | ICD-10-CM | POA: Diagnosis not present

## 2022-01-07 DIAGNOSIS — R0602 Shortness of breath: Secondary | ICD-10-CM | POA: Diagnosis not present

## 2022-01-07 DIAGNOSIS — I352 Nonrheumatic aortic (valve) stenosis with insufficiency: Secondary | ICD-10-CM | POA: Diagnosis not present

## 2022-01-07 DIAGNOSIS — I358 Other nonrheumatic aortic valve disorders: Secondary | ICD-10-CM | POA: Diagnosis not present

## 2022-01-07 DIAGNOSIS — R7989 Other specified abnormal findings of blood chemistry: Secondary | ICD-10-CM | POA: Diagnosis not present

## 2022-01-08 DIAGNOSIS — A409 Streptococcal sepsis, unspecified: Secondary | ICD-10-CM | POA: Diagnosis not present

## 2022-01-08 DIAGNOSIS — L03114 Cellulitis of left upper limb: Secondary | ICD-10-CM | POA: Diagnosis not present

## 2022-01-14 DIAGNOSIS — R7989 Other specified abnormal findings of blood chemistry: Secondary | ICD-10-CM | POA: Diagnosis not present

## 2022-01-14 DIAGNOSIS — I358 Other nonrheumatic aortic valve disorders: Secondary | ICD-10-CM | POA: Diagnosis not present

## 2022-01-15 DIAGNOSIS — J0100 Acute maxillary sinusitis, unspecified: Secondary | ICD-10-CM | POA: Diagnosis not present

## 2022-01-29 DIAGNOSIS — J0100 Acute maxillary sinusitis, unspecified: Secondary | ICD-10-CM | POA: Diagnosis not present

## 2022-01-29 DIAGNOSIS — J40 Bronchitis, not specified as acute or chronic: Secondary | ICD-10-CM | POA: Diagnosis not present

## 2022-01-30 DIAGNOSIS — I350 Nonrheumatic aortic (valve) stenosis: Secondary | ICD-10-CM | POA: Diagnosis not present

## 2022-02-05 ENCOUNTER — Encounter: Payer: Self-pay | Admitting: Adult Health

## 2022-02-05 ENCOUNTER — Telehealth (INDEPENDENT_AMBULATORY_CARE_PROVIDER_SITE_OTHER): Payer: Medicare PPO | Admitting: Adult Health

## 2022-02-05 DIAGNOSIS — I358 Other nonrheumatic aortic valve disorders: Secondary | ICD-10-CM | POA: Diagnosis not present

## 2022-02-05 NOTE — Progress Notes (Signed)
Virtual Visit via Video Note  I connected with Chad Belsky Sr. on 02/05/22 at 11:30 AM EST by a video enabled telemedicine application and verified that I am speaking with the correct person using two identifiers.  Location patient: home Location provider:work or home office Persons participating in the virtual visit: patient, provider  I discussed the limitations of evaluation and management by telemedicine and the availability of in person appointments. The patient expressed understanding and agreed to proceed.   HPI:  On December 02, 2021 he was admitted to [facility] for generalized fatigue and fevers.  On workup he had a white cell count of 17.4 and his troponin was 58.  His echo showed signs concerning for endocarditis.  He was started on IV vancomycin and Rocephin initially.  Blood cultures from 10 9 and showed Streptococcus gordonii was sensitive to Vanco and ceftriaxone; Vanco stopped and ceftriaxone was continued.  Panorex was ordered by ID is suspecting endocarditis in the setting of recent dental cleaning  Had a PICC line placed and went through 6 weeks of antibiotic infusions.  He was readmitted on 12/14/2021 for cellulitis of the left arm.  Most recent echocardiogram done last week showed normal pumping capacity.  Aortic stenosis and regurgitation persistent without any severe degree.  He will need a cardiologist in [place] since he has returned home.  This I will follow-up with the arm with cardiology in [place] from January 22  Overall he is feeling well, has not had any fevers or chills.   ROS: See pertinent positives and negatives per HPI.  Past Medical History:  Diagnosis Date   AR (allergic rhinitis)    Bilateral club feet    surgery and slinting as child   BPH (benign prostatic hyperplasia)    FUNGAL DERMATITIS 11/30/2009   HYPERTENSION 01/28/2007   Kidney stone    06/2009   PILONIDAL CYST, WITH ABSCESS 08/06/2007    Past Surgical History:   Procedure Laterality Date   CLUB FOOT RELEASE     bilat - as a child    Family History  Problem Relation Age of Onset   Cancer Neg Hx        colon ca   Hypertension Neg Hx        familyl hx       Current Outpatient Medications:    CLARITIN 10 MG tablet, , Disp: , Rfl:    cyclobenzaprine (FLEXERIL) 10 MG tablet, Take 1 tablet (10 mg total) by mouth 3 (three) times daily as needed for muscle spasms., Disp: 30 tablet, Rfl: 0   desloratadine (CLARINEX) 5 MG tablet, , Disp: , Rfl:    ezetimibe (ZETIA) 10 MG tablet, Take 1 tablet (10 mg total) by mouth daily., Disp: 90 tablet, Rfl: 3   finasteride (PROSCAR) 5 MG tablet, Take 5 mg by mouth daily., Disp: , Rfl:    ibuprofen (ADVIL) 200 MG tablet, Take 200 mg by mouth every 6 (six) hours as needed., Disp: , Rfl:    ipratropium (ATROVENT) 0.03 % nasal spray, Place 2 sprays into both nostrils 3 (three) times daily as needed., Disp: , Rfl:    latanoprost (XALATAN) 0.005 % ophthalmic solution, , Disp: , Rfl:    losartan (COZAAR) 25 MG tablet, TAKE 1 TABLET BY MOUTH TWICE A DAY, Disp: 180 tablet, Rfl: 0   methylPREDNISolone (MEDROL DOSEPAK) 4 MG TBPK tablet, Take as directed, Disp: 21 tablet, Rfl: 0   Multiple Vitamins-Minerals (MENS 50+ ADVANCED PO), Take 1 tablet by mouth  daily., Disp: , Rfl:    triamcinolone cream (KENALOG) 0.1 %, Apply topically 2 (two) times daily., Disp: 30 g, Rfl: 3  EXAM:  VITALS per patient if applicable:  GENERAL: alert, oriented, appears well and in no acute distress  HEENT: atraumatic, conjunttiva clear, no obvious abnormalities on inspection of external nose and ears  NECK: normal movements of the head and neck  LUNGS: on inspection no signs of respiratory distress, breathing rate appears normal, no obvious gross SOB, gasping or wheezing  CV: no obvious cyanosis  MS: moves all visible extremities without noticeable abnormality  PSYCH/NEURO: pleasant and cooperative, no obvious depression or anxiety,  speech and thought processing grossly intact  ASSESSMENT AND PLAN:  Discussed the following assessment and plan:  1. Endocarditis of aortic valve - Reviewed notes in Epic. - Ambulatory referral to Cardiology      I discussed the assessment and treatment plan with the patient. The patient was provided an opportunity to ask questions and all were answered. The patient agreed with the plan and demonstrated an understanding of the instructions.   The patient was advised to call back or seek an in-person evaluation if the symptoms worsen or if the condition fails to improve as anticipated.   Shirline Frees, NP

## 2022-02-14 DIAGNOSIS — L218 Other seborrheic dermatitis: Secondary | ICD-10-CM | POA: Diagnosis not present

## 2022-02-14 DIAGNOSIS — L821 Other seborrheic keratosis: Secondary | ICD-10-CM | POA: Diagnosis not present

## 2022-02-14 DIAGNOSIS — Z85828 Personal history of other malignant neoplasm of skin: Secondary | ICD-10-CM | POA: Diagnosis not present

## 2022-02-19 ENCOUNTER — Encounter: Payer: Self-pay | Admitting: Cardiovascular Disease

## 2022-02-19 ENCOUNTER — Ambulatory Visit: Payer: Medicare PPO | Attending: Cardiovascular Disease | Admitting: Cardiovascular Disease

## 2022-02-19 VITALS — BP 180/60 | HR 73 | Ht 68.0 in | Wt 162.4 lb

## 2022-02-19 DIAGNOSIS — Q231 Congenital insufficiency of aortic valve: Secondary | ICD-10-CM | POA: Diagnosis not present

## 2022-02-19 DIAGNOSIS — I10 Essential (primary) hypertension: Secondary | ICD-10-CM

## 2022-02-19 DIAGNOSIS — E782 Mixed hyperlipidemia: Secondary | ICD-10-CM

## 2022-02-19 DIAGNOSIS — I350 Nonrheumatic aortic (valve) stenosis: Secondary | ICD-10-CM | POA: Diagnosis not present

## 2022-02-19 DIAGNOSIS — I358 Other nonrheumatic aortic valve disorders: Secondary | ICD-10-CM | POA: Insufficient documentation

## 2022-02-19 DIAGNOSIS — Q2381 Bicuspid aortic valve: Secondary | ICD-10-CM | POA: Insufficient documentation

## 2022-02-19 DIAGNOSIS — E785 Hyperlipidemia, unspecified: Secondary | ICD-10-CM | POA: Insufficient documentation

## 2022-02-19 MED ORDER — AMLODIPINE BESYLATE 2.5 MG PO TABS: 2.5000 mg | ORAL_TABLET | Freq: Every day | ORAL | 3 refills | Status: DC

## 2022-02-19 MED ORDER — AMOXICILLIN 500 MG PO TABS: 2000.0000 mg | ORAL_TABLET | ORAL | 4 refills | Status: DC | PRN

## 2022-02-19 NOTE — Assessment & Plan Note (Signed)
History of essential hypertension with blood pressure measured at 180/60 which I suspect is somewhat "whitecoat hypertension".  I reviewed his blood pressure log at home which shows for the most part normal blood pressure readings.  He is on amlodipine, metoprolol and losartan.

## 2022-02-19 NOTE — Assessment & Plan Note (Signed)
History of hyperlipidemia on statin therapy with lipid profile performed 08/07/2021 revealing total cholesterol 181, LDL 117 and HDL 49.

## 2022-02-19 NOTE — Assessment & Plan Note (Signed)
Chad Yoder recently had aortic valve endocarditis and was hospitalized at [facility] in [facility] October 8 through 16.  He did have a bicuspid aortic valve with moderate aortic stenosis moderate AI.  He was treated with 6 weeks of IV antibiotics through PICC line.  This happened after a dental cleaning 1 week prior.  He will need SBE prophylaxis for future dental procedures.

## 2022-02-19 NOTE — Assessment & Plan Note (Signed)
2D echo performed 01/31/2022 revealed normal LV systolic function, bicuspid aortic valve with mild to moderate aortic stenosis and moderate AI.  Aortic valve area was 1.7 cm.

## 2022-02-19 NOTE — Patient Instructions (Addendum)
Medication Instructions:  Your physician has recommended you make the following change in your medication:   -Decrease amlodipine (norvasc) to 2.5mg  once daily.  -Take amoxicillin (amoxil) 2g 30-60 minutes prior to dental procedures.  *If you need a refill on your cardiac medications before your next appointment, please call your pharmacy*   Lab Work: Your physician recommends that you have labs drawn today: BMET  If you have labs (blood work) drawn today and your tests are completely normal, you will receive your results only by: MyChart Message (if you have MyChart) OR A paper copy in the mail If you have any lab test that is abnormal or we need to change your treatment, we will call you to review the results.   Testing/Procedures: Your physician has requested that you have a carotid duplex. This test is an ultrasound of the carotid arteries in your neck. It looks at blood flow through these arteries that supply the brain with blood. Allow one hour for this exam. There are no restrictions or special instructions. This will take place at [address] [facility], [address].  Your physician has requested that you have an echocardiogram. Echocardiography is a painless test that uses sound waves to create images of your heart. It provides your doctor with information about the size and shape of your heart and how well your heart's chambers and valves are working. This procedure takes approximately one hour. There are no restrictions for this procedure. Please do NOT wear cologne, perfume, aftershave, or lotions (deodorant is allowed). Please arrive 15 minutes prior to your appointment time.  To be done in December 2024. This procedure will be done at [address] [place]   Follow-Up: At [facility], you and your health needs are our priority.  As part of our continuing mission to provide you with exceptional heart care, we have created designated Provider Care Teams.  These Care  Teams include your primary Cardiologist (physician) and Advanced Practice Providers (APPs -  Physician Assistants and Nurse Practitioners) who all work together to provide you with the care you need, when you need it.  We recommend signing up for the patient portal called "MyChart".  Sign up information is provided on this After Visit Summary.  MyChart is used to connect with patients for Virtual Visits (Telemedicine).  Patients are able to view lab/test results, encounter notes, upcoming appointments, etc.  Non-urgent messages can be sent to your provider as well.   To learn more about what you can do with MyChart, go to [email or web address].    Your next appointment:   6 month(s)  The format for your next appointment:   In Person  Provider:   Micah Flesher, PA-C, Marjie Skiff, PA-C, Juanda Crumble, PA-C, Joni Reining, DNP, ANP, Azalee Course, PA-C, or Bernadene Person, NP       Then, Nanetta Batty, MD  will plan to see you again in 12 month(s).    Other Instructions   Your cardiac CT will be scheduled at the below location:   [facility] [address] [place], [place] [address] [phone]  If scheduled at [facility], please arrive at the [facility] and Children's Entrance (Entrance C2) of [facility] 30 minutes prior to test start time. You can use the FREE valet parking offered at entrance C (encouraged to control the heart rate for the test)  Proceed to the [facility] Radiology Department (first floor) to check-in and test prep.  All radiology patients  and guests should use entrance C2 at [facility], accessed from [facility], even though the hospital's physical address listed is [address]     Please follow these instructions carefully (unless otherwise directed):  Hold all erectile dysfunction medications at least 3 days (72 hrs) prior to test. (Ie viagra, cialis, sildenafil, tadalafil, etc) We will  administer nitroglycerin during this exam.   On the Night Before the Test: Be sure to Drink plenty of water. Do not consume any caffeinated/decaffeinated beverages or chocolate 12 hours prior to your test. Do not take any antihistamines 12 hours prior to your test.  On the Day of the Test: Drink plenty of water until 1 hour prior to the test. Do not eat any food 1 hour prior to test. You may take your regular medications prior to the test.  Take metoprolol (Lopressor) 100mg  two hours prior to test. HOLD Furosemide/Hydrochlorothiazide morning of the test.       After the Test: Drink plenty of water. After receiving IV contrast, you may experience a mild flushed feeling. This is normal. On occasion, you may experience a mild rash up to 24 hours after the test. This is not dangerous. If this occurs, you can take Benadryl 25 mg and increase your fluid intake. If you experience trouble breathing, this can be serious. If it is severe call 911 IMMEDIATELY. If it is mild, please call our office. If you take any of these medications: Glipizide/Metformin, Avandament, Glucavance, please do not take 48 hours after completing test unless otherwise instructed.  We will call to schedule your test 2-4 weeks out understanding that some insurance companies will need an authorization prior to the service being performed.   For non-scheduling related questions, please contact the cardiac imaging nurse navigator should you have any questions/concerns: , Cardiac Imaging Nurse Navigator [place], Cardiac Imaging Nurse Navigator [facility] Direct Office Dial: [phone]   For scheduling needs, including cancellations and rescheduling, please call [phone], [phone].

## 2022-02-19 NOTE — Progress Notes (Signed)
02/19/2022 Chad Nephew Sr.   Jul 29, 1945  [id number]  Primary Physician Nafziger, Kandee Keen, NP Primary Cardiologist: Runell Gess MD Nicholes Calamity, [place]  HPI:  Chad Nephew Sr. is a 76 y.o. thin-appearing married Caucasian male father 2 children, grandfather 2 grandchildren who was referred by Buddy Duty nurse practitioner because of recent history of endocarditis.  His brother Chad Yoder is a patient of mine as well.  He is retired from being a Quarry manager at [employer].  His cardiac risk factors are notable for treated hypertension and hyperlipidemia.  He is not diabetic.  There is no family history of heart disease Chad Yoder is never had a heart tach or stroke.  He is fairly active and does yard work and walks for 30 minutes a day.  He apparently had a dental cleaning in mid October and was hospitalized at [facility] 12/02/2021 for 8 days diagnosed with aortic valve endocarditis.  He was treated with IV antibiotics for 6 weeks via PICC line.  His most recent 2D echo performed 01/31/2022 revealed normal LV systolic function, bicuspid aortic valve with moderate AI and mild to moderate AS with an aortic valve area 1.7 cm.  He did have a stress test notably on 01/07/2022 that was abnormal with a reversible defect in the distal anteroapical wall although he is completely asymptomatic.   Current Meds  Medication Sig   amLODipine (NORVASC) 5 MG tablet Take 5 mg by mouth daily.   CLARITIN 10 MG tablet    desloratadine (CLARINEX) 5 MG tablet    ezetimibe (ZETIA) 10 MG tablet Take 1 tablet (10 mg total) by mouth daily.   finasteride (PROSCAR) 5 MG tablet Take 5 mg by mouth daily.   ibuprofen (ADVIL) 200 MG tablet Take 200 mg by mouth every 6 (six) hours as needed.   ipratropium (ATROVENT) 0.03 % nasal spray Place 2 sprays into both nostrils 3 (three) times daily as needed.   latanoprost (XALATAN) 0.005 % ophthalmic solution     losartan (COZAAR) 25 MG tablet TAKE 1 TABLET BY MOUTH TWICE A DAY (Patient taking differently: 100 mg.)   methylPREDNISolone (MEDROL DOSEPAK) 4 MG TBPK tablet Take as directed   metoprolol tartrate (LOPRESSOR) 50 MG tablet Take 50 mg by mouth.   Multiple Vitamins-Minerals (MENS 50+ ADVANCED PO) Take 1 tablet by mouth daily.   triamcinolone cream (KENALOG) 0.1 % Apply topically 2 (two) times daily.     Allergies  Allergen Reactions   Codeine     REACTION: N\T\ V   Influenza Virus Vaccine Other (See Comments)   Statins Diarrhea    Pt stated, "this gave me uncontrollable bowels"   Sulfonamide Derivatives     REACTION: rash    Social History   Socioeconomic History   Marital status: Married    Spouse name: Not on file   Number of children: Not on file   Years of education: Not on file   Highest education level: Not on file  Occupational History   Not on file  Tobacco Use   Smoking status: Never   Smokeless tobacco: Never  Vaping Use   Vaping Use: Never used  Substance and Sexual Activity   Alcohol use: Yes    Comment: rarley    Drug use: Never   Sexual activity: Not on file  Other Topics Concern   Not on file  Social History Narrative   Not on file   Social Determinants of  Health   Financial Resource Strain: Low Risk  (05/16/2020)   Overall Financial Resource Strain (CARDIA)    Difficulty of Paying Living Expenses: Not hard at all  Food Insecurity: No Food Insecurity (05/16/2020)   Hunger Vital Sign    Worried About Running Out of Food in the Last Year: Never true    Ran Out of Food in the Last Year: Never true  Transportation Needs: No Transportation Needs (05/16/2020)   PRAPARE - Administrator, Civil Service (Medical): No    Lack of Transportation (Non-Medical): No  Physical Activity: Sufficiently Active (05/16/2020)   Exercise Vital Sign    Days of Exercise per Week: 7 days    Minutes of Exercise per Session: 40 min  Stress: No Stress Concern Present  (05/16/2020)   [employer] of Occupational Health - Occupational Stress Questionnaire    Feeling of Stress : Not at all  Social Connections: Moderately Isolated (05/16/2020)   Social Connection and Isolation Panel [NHANES]    Frequency of Communication with Friends and Family: More than three times a week    Frequency of Social Gatherings with Friends and Family: Once a week    Attends Religious Services: Never    Database administrator or Organizations: No    Attends Banker Meetings: Never    Marital Status: Married  Catering manager Violence: Not At Risk (05/16/2020)   Humiliation, Afraid, Rape, and Kick questionnaire    Fear of Current or Ex-Partner: No    Emotionally Abused: No    Physically Abused: No    Sexually Abused: No     Review of Systems: General: negative for chills, fever, night sweats or weight changes.  Cardiovascular: negative for chest pain, dyspnea on exertion, edema, orthopnea, palpitations, paroxysmal nocturnal dyspnea or shortness of breath Dermatological: negative for rash Respiratory: negative for cough or wheezing Urologic: negative for hematuria Abdominal: negative for nausea, vomiting, diarrhea, bright red blood per rectum, melena, or hematemesis Neurologic: negative for visual changes, syncope, or dizziness All other systems reviewed and are otherwise negative except as noted above.    Blood pressure (!) 180/60, pulse 73, height 5\' 8"  (1.727 m), weight 162 lb 6.4 oz (73.7 kg), SpO2 98 %.  General appearance: alert and no distress Neck: no adenopathy, no JVD, supple, symmetrical, trachea midline, thyroid not enlarged, symmetric, no tenderness/mass/nodules, and carotid bruits bilaterally Lungs: clear to auscultation bilaterally Heart: 2/6 outflow tract murmur consistent with aortic stenosis. Extremities: extremities normal, atraumatic, no cyanosis or edema Pulses: 2+ and symmetric Skin: Skin color, texture, turgor normal. No rashes or  lesions Neurologic: Grossly normal  EKG sinus rhythm at 73 without ST or T wave changes.  Personally reviewed this EKG.  ASSESSMENT AND PLAN:   Essential hypertension History of essential hypertension with blood pressure measured at 180/60 which I suspect is somewhat "whitecoat hypertension".  I reviewed his blood pressure log at home which shows for the most part normal blood pressure readings.  He is on amlodipine, metoprolol and losartan.  Hyperlipidemia History of hyperlipidemia on statin therapy with lipid profile performed 08/07/2021 revealing total cholesterol 181, LDL 117 and HDL 49.  Endocarditis of aortic valve Mr. Henslee recently had aortic valve endocarditis and was hospitalized at [facility] in [facility] October 8 through 16.  He did have a bicuspid aortic valve with moderate aortic stenosis moderate AI.  He was treated with 6 weeks of IV antibiotics through PICC line.  This happened after a dental cleaning  1 week prior.  He will need SBE prophylaxis for future dental procedures.  Bicuspid aortic valve 2D echo performed 01/31/2022 revealed normal LV systolic function, bicuspid aortic valve with mild to moderate aortic stenosis and moderate AI.  Aortic valve area was 1.7 cm.     Runell Gess MD FACP,FACC,FAHA, [facility] 02/19/2022 2:38 PM

## 2022-02-20 LAB — BASIC METABOLIC PANEL
BUN/Creatinine Ratio: 17 (ref 10–24)
BUN: 20 mg/dL (ref 8–27)
CO2: 24 mmol/L (ref 20–29)
Calcium: 9.1 mg/dL (ref 8.6–10.2)
Chloride: 106 mmol/L (ref 96–106)
Creatinine, Ser: 1.18 mg/dL (ref 0.76–1.27)
Glucose: 104 mg/dL — ABNORMAL HIGH (ref 70–99)
Potassium: 4.3 mmol/L (ref 3.5–5.2)
Sodium: 141 mmol/L (ref 134–144)
eGFR: 64 mL/min/{1.73_m2} (ref >59)

## 2022-02-21 ENCOUNTER — Other Ambulatory Visit: Payer: Self-pay | Admitting: Cardiovascular Disease

## 2022-02-21 DIAGNOSIS — I10 Essential (primary) hypertension: Secondary | ICD-10-CM

## 2022-02-21 DIAGNOSIS — E782 Mixed hyperlipidemia: Secondary | ICD-10-CM

## 2022-02-21 DIAGNOSIS — Q231 Congenital insufficiency of aortic valve: Secondary | ICD-10-CM

## 2022-02-21 DIAGNOSIS — R0989 Other specified symptoms and signs involving the circulatory and respiratory systems: Secondary | ICD-10-CM

## 2022-02-21 DIAGNOSIS — I350 Nonrheumatic aortic (valve) stenosis: Secondary | ICD-10-CM

## 2022-02-21 DIAGNOSIS — I358 Other nonrheumatic aortic valve disorders: Secondary | ICD-10-CM

## 2022-02-26 ENCOUNTER — Ambulatory Visit (HOSPITAL_COMMUNITY)
Admission: RE | Admit: 2022-02-26 | Discharge: 2022-02-26 | Disposition: A | Discharge status: Home / Self Care | Payer: Medicare PPO | Source: Ambulatory Visit | Attending: Internal Medicine | Admitting: Internal Medicine

## 2022-02-26 DIAGNOSIS — Q231 Congenital insufficiency of aortic valve: Secondary | ICD-10-CM | POA: Insufficient documentation

## 2022-02-26 DIAGNOSIS — I350 Nonrheumatic aortic (valve) stenosis: Secondary | ICD-10-CM | POA: Diagnosis not present

## 2022-02-26 DIAGNOSIS — E782 Mixed hyperlipidemia: Secondary | ICD-10-CM

## 2022-02-26 DIAGNOSIS — R0989 Other specified symptoms and signs involving the circulatory and respiratory systems: Secondary | ICD-10-CM | POA: Insufficient documentation

## 2022-02-26 DIAGNOSIS — I358 Other nonrheumatic aortic valve disorders: Secondary | ICD-10-CM

## 2022-02-26 DIAGNOSIS — I10 Essential (primary) hypertension: Secondary | ICD-10-CM | POA: Diagnosis not present

## 2022-03-13 ENCOUNTER — Telehealth (HOSPITAL_COMMUNITY): Payer: Self-pay | Admitting: *Deleted

## 2022-03-13 NOTE — Telephone Encounter (Signed)
Calling patient to r/s his CCTA appt due to lack of insurance authorization.  Left voicemail with name and call back number.

## 2022-03-13 NOTE — Telephone Encounter (Signed)
Attempted to call patient regarding upcoming cardiac CT appointment. °Left message on voicemail with name and callback number ° °Anjoli Diemer RN Navigator Cardiac Imaging °[facility] °[phone] Office °[phone] Cell ° °

## 2022-03-14 ENCOUNTER — Ambulatory Visit (HOSPITAL_COMMUNITY): Admission: RE | Admit: 2022-03-14 | Payer: Medicare PPO | Source: Ambulatory Visit

## 2022-03-20 ENCOUNTER — Telehealth: Payer: Self-pay | Admitting: Cardiovascular Disease

## 2022-03-20 MED ORDER — METOPROLOL TARTRATE 50 MG PO TABS: 50.0000 mg | ORAL_TABLET | Freq: Every day | ORAL | 3 refills | Status: DC

## 2022-03-20 NOTE — Telephone Encounter (Signed)
*  STAT* If patient is at the pharmacy, call can be transferred to refill team.   1. Which medications need to be refilled? (please list name of each medication and dose if known) new prescription for Metoprolol 50 mg  2. Which pharmacy/location (including street and city if local pharmacy) is medication to be sent to? [facility] [address], [place]  3. Do they need a 30 day or 90 day supply? 90 days and refills

## 2022-03-21 ENCOUNTER — Telehealth (HOSPITAL_COMMUNITY): Payer: Self-pay | Admitting: *Deleted

## 2022-03-21 NOTE — Telephone Encounter (Signed)
Reaching out to patient to offer assistance regarding upcoming cardiac imaging study; pt verbalizes understanding of appt date/time, parking situation and where to check in, pre-test NPO status; name and call back number provided for further questions should they arise  Chad Clement RN Navigator Cardiac [place] [facility] [phone] office [phone] cell  Patient states his HR is in the mid 60's. He takes his normal metoprolol in the evening. Advised him to check his HR and take an additional 50mg  if his HR is greater than 65bpm.  He is aware to arrive at 10:30am.

## 2022-03-22 ENCOUNTER — Ambulatory Visit (HOSPITAL_COMMUNITY)
Admission: RE | Admit: 2022-03-22 | Discharge: 2022-03-22 | Disposition: A | Discharge status: Home / Self Care | Payer: Medicare PPO | Source: Ambulatory Visit | Attending: Cardiovascular Disease | Admitting: Cardiovascular Disease

## 2022-03-22 ENCOUNTER — Other Ambulatory Visit: Payer: Self-pay | Admitting: Internal Medicine

## 2022-03-22 DIAGNOSIS — I10 Essential (primary) hypertension: Secondary | ICD-10-CM | POA: Insufficient documentation

## 2022-03-22 DIAGNOSIS — I2510 Atherosclerotic heart disease of native coronary artery without angina pectoris: Secondary | ICD-10-CM

## 2022-03-22 DIAGNOSIS — I358 Other nonrheumatic aortic valve disorders: Secondary | ICD-10-CM | POA: Diagnosis not present

## 2022-03-22 DIAGNOSIS — E782 Mixed hyperlipidemia: Secondary | ICD-10-CM | POA: Insufficient documentation

## 2022-03-22 DIAGNOSIS — I350 Nonrheumatic aortic (valve) stenosis: Secondary | ICD-10-CM | POA: Diagnosis not present

## 2022-03-22 DIAGNOSIS — Q231 Congenital insufficiency of aortic valve: Secondary | ICD-10-CM | POA: Diagnosis not present

## 2022-03-22 DIAGNOSIS — R931 Abnormal findings on diagnostic imaging of heart and coronary circulation: Secondary | ICD-10-CM | POA: Insufficient documentation

## 2022-03-22 MED ORDER — NITROGLYCERIN 0.4 MG SL SUBL
SUBLINGUAL_TABLET | SUBLINGUAL | Status: AC
  Filled 2022-03-22: qty 2

## 2022-03-22 MED ORDER — IOHEXOL 350 MG/ML SOLN
100.0000 mL | Freq: Once | INTRAVENOUS | Status: AC | PRN
  Administered 2022-03-22: 100 mL via INTRAVENOUS

## 2022-03-22 MED ORDER — NITROGLYCERIN 0.4 MG SL SUBL
0.8000 mg | SUBLINGUAL_TABLET | Freq: Once | SUBLINGUAL | Status: AC
  Administered 2022-03-22: 0.8 mg via SUBLINGUAL

## 2022-03-22 NOTE — Progress Notes (Signed)
Cardiac CT sent for FFR. 

## 2022-03-23 ENCOUNTER — Ambulatory Visit (HOSPITAL_BASED_OUTPATIENT_CLINIC_OR_DEPARTMENT_OTHER)
Admission: RE | Admit: 2022-03-23 | Discharge: 2022-03-23 | Disposition: A | Discharge status: Home / Self Care | Payer: Medicare PPO | Source: Ambulatory Visit | Attending: Internal Medicine | Admitting: Internal Medicine

## 2022-03-23 DIAGNOSIS — I358 Other nonrheumatic aortic valve disorders: Secondary | ICD-10-CM | POA: Diagnosis not present

## 2022-03-23 DIAGNOSIS — E782 Mixed hyperlipidemia: Secondary | ICD-10-CM | POA: Diagnosis not present

## 2022-03-23 DIAGNOSIS — R931 Abnormal findings on diagnostic imaging of heart and coronary circulation: Secondary | ICD-10-CM

## 2022-03-23 DIAGNOSIS — I350 Nonrheumatic aortic (valve) stenosis: Secondary | ICD-10-CM | POA: Diagnosis not present

## 2022-03-23 DIAGNOSIS — Q231 Congenital insufficiency of aortic valve: Secondary | ICD-10-CM | POA: Diagnosis not present

## 2022-03-23 DIAGNOSIS — I10 Essential (primary) hypertension: Secondary | ICD-10-CM | POA: Diagnosis not present

## 2022-03-27 NOTE — Progress Notes (Signed)
Cardiology Clinic Note   Patient Name: Chad NGUYEN Sr. Date of Encounter: 03/29/2022  Primary Care Provider:  Shirline Frees, NP Primary Cardiologist:  Nanetta Batty, MD  Patient Profile    Chad Nephew Sr. is a 77 y.o. male with a past medical history of nonobstructive CAD per coronary CT, endocarditis of aortic valve/bicuspid aortic valve/aortic stenosis, hypertension, hyperlipidemia who presents to the clinic today for follow-up after testing.  Past Medical History    Past Medical History:  Diagnosis Date   AR (allergic rhinitis)    Bilateral club feet    surgery and slinting as child   BPH (benign prostatic hyperplasia)    FUNGAL DERMATITIS 11/30/2009   HYPERTENSION 01/28/2007   Kidney stone    06/2009   PILONIDAL CYST, WITH ABSCESS 08/06/2007   Past Surgical History:  Procedure Laterality Date   CLUB FOOT RELEASE     bilat - as a child    Allergies  Allergies  Allergen Reactions   Codeine     REACTION: N\T\ V   Influenza Virus Vaccine Other (See Comments)   Statins Diarrhea    Pt stated, "this gave me uncontrollable bowels"   Sulfonamide Derivatives     REACTION: rash    History of Present Illness    Chad Nephew Sr. has a past medical history of: Nonobstructive CAD. Nuclear stress test performed at [facility], [place] 01/07/2022: Small to moderate size mild intensity reversible perfusion defect of distal anterior apical wall consistent with ischemia.  There is also small to moderate size moderate intensity fixed perfusion defect of mid basal inferior wall suggestive of possible infarction. Coronary CT with FFR 03/22/2022: Coronary calcium score 734 (71st percentile).  Aortic atherosclerosis and aortic valve annular/leaflet calcification. FFR analysis did not show any significant stenosis.  Endocarditis of aortic valve/bicuspid aortic valve/aortic stenosis. Echo performed at [facility], [place] 12/04/2021: EF 55%.   Asymmetric LVH.  Mild to moderate AR.  Mild MR.  Mild TR.  Linear echodensity on the noncoronary leaflet prolapsing into LVOT consistent with valvular vegetation and endocarditis. Echo performed at [facility], [place]  01/30/2022: EF 70%.  No obvious evidence of vegetation on aortic valve.  Aortic valve appears functionally bicuspid with mild-moderate focal thickening/calcification on noncoronary cusp.  Mild aortic valve stenosis.  Moderate AR.  Trivial to small pericardial effusion.  Mild to moderate AS and moderate AI. Hypertension. Hyperlipidemia. Lipid panel 08/07/2021: LDL 117, HDL 50, TG 72, total 181.  Chad Yoder was first evaluated by Dr. Allyson Sabal on 02/19/2022 for recent history of endocarditis as requested by PCP, Buddy Duty, NP.  Patient and his wife were in [place] visiting family for planned 78-month stay.  Patient was admitted to [facility] in [place], [place] for fatigue and fevers one week after a dental cleaning.  He was found to have aortic valve endocarditis and underwent treatment with IV antibiotics for 6 weeks via PICC line. He made a second trip to the hospital with a secondary infection of his left arm which resolved with IV antibiotic treatments. Remainder of history outlined above. Patient underwent Coronary CT per Dr. Allyson Sabal (as above).   Today, patient is accompanied by his wife. He denies shortness of breath or dyspnea on exertion. No chest pain, pressure, or tightness. Denies lower extremity edema, orthopnea, or PND. No palpitations.  Discussed coronary CT results and showed patient and his wife pictures from the scan.  Patient is very active walking  at least 30 minutes every day with his wife and doing all of his own yard work.  Patient has an upcoming dental appointment to have cavities filled and we discussed appropriate amoxicillin dosing for that appointment.  Patient also mentions that he is statin intolerant, which is common in his family.   He currently takes Zetia twice a week, as this is all he can tolerate.  Patient hypertensive today with BP 192/54 at intake at 160/56 on recheck.  Patient reports his BP is always high at the doctor's office.  He provides a BP log from 02/21/2022 to 03/28/2022.  BP typically runs in the 140s over 60s.  He reports SBP is any lower than 130 cause dizziness.  Will scan log into chart.   Home Medications    Current Meds  Medication Sig   amLODipine (NORVASC) 2.5 MG tablet Take 1 tablet (2.5 mg total) by mouth daily.   aspirin EC 81 MG tablet Take 81 mg by mouth daily.   CLARITIN 10 MG tablet    desloratadine (CLARINEX) 5 MG tablet Take 5 mg by mouth as needed.   ezetimibe (ZETIA) 10 MG tablet Take 1 tablet (10 mg total) by mouth daily.   finasteride (PROSCAR) 2.5 mg tablet Take 1 tablet by mouth daily.   Ibuprofen 200 MG CAPS Take 200 mg by mouth daily as needed.   latanoprost (XALATAN) 0.005 % ophthalmic solution Place 1 drop into both eyes at bedtime.   losartan (COZAAR) 100 MG tablet Take 100 mg by mouth daily.   metoprolol tartrate (LOPRESSOR) 50 MG tablet Take 1 tablet (50 mg total) by mouth daily. Take 50 mg by mouth.   Multiple Vitamins-Minerals (MENS 50+ ADVANCED PO) Take 1 tablet by mouth daily.   triamcinolone cream (KENALOG) 0.1 % Apply topically 2 (two) times daily.    Family History    Family History  Problem Relation Age of Onset   Cancer Neg Hx        colon ca   Hypertension Neg Hx        familyl hx   He indicated that the status of his neg hx is unknown.   Social History    Social History   Socioeconomic History   Marital status: Married    Spouse name: Not on file   Number of children: Not on file   Years of education: Not on file   Highest education level: Not on file  Occupational History   Not on file  Tobacco Use   Smoking status: Never   Smokeless tobacco: Never  Vaping Use   Vaping Use: Never used  Substance and Sexual Activity   Alcohol use: Yes     Comment: rarley    Drug use: Never   Sexual activity: Not on file  Other Topics Concern   Not on file  Social History Narrative   Not on file   Social Determinants of Health   Financial Resource Strain: Low Risk  (05/16/2020)   Overall Financial Resource Strain (CARDIA)    Difficulty of Paying Living Expenses: Not hard at all  Food Insecurity: No Food Insecurity (05/16/2020)   Hunger Vital Sign    Worried About Running Out of Food in the Last Year: Never true    [place] in the Last Year: Never true  Transportation Needs: No Transportation Needs (05/16/2020)   PRAPARE - Hydrologist (Medical): No    Lack of Transportation (Non-Medical): No  Physical  Activity: Sufficiently Active (05/16/2020)   Exercise Vital Sign    Days of Exercise per Week: 7 days    Minutes of Exercise per Session: 40 min  Stress: No Stress Concern Present (05/16/2020)   [place]    Feeling of Stress : Not at all  Social Connections: Moderately Isolated (05/16/2020)   Social Connection and Isolation Panel [NHANES]    Frequency of Communication with Friends and Family: More than three times a week    Frequency of Social Gatherings with Friends and Family: Once a week    Attends Religious Services: Never    Marine scientist or Organizations: No    Attends Archivist Meetings: Never    Marital Status: Married  Human resources officer Violence: Not At Risk (05/16/2020)   Humiliation, Afraid, Rape, and Kick questionnaire    Fear of Current or Ex-Partner: No    Emotionally Abused: No    Physically Abused: No    Sexually Abused: No     Review of Systems    General:  No chills, fever, night sweats or weight changes.  Cardiovascular:  No chest pain, dyspnea on exertion, edema, orthopnea, palpitations, paroxysmal nocturnal dyspnea. Dermatological: No rash, lesions/masses Respiratory: No cough,  dyspnea Urologic: No hematuria, dysuria Abdominal:   No nausea, vomiting, diarrhea, bright red blood per rectum, melena, or hematemesis Neurologic:  No visual changes, weakness, changes in mental status. All other systems reviewed and are otherwise negative except as noted above.  Physical Exam    VS:  BP (!) 160/56 (BP Location: Left Arm, Patient Position: Sitting, Cuff Size: Normal)   Pulse 73   Ht 5\' 8"  (1.727 m)   Wt 166 lb 6.4 oz (75.5 kg)   SpO2 98%   BMI 25.30 kg/m  , BMI Body mass index is 25.3 kg/m. GEN:  Well nourished, well developed, in no acute distress. HEENT: Normal. Neck: Supple, no JVD, carotid bruits, or masses. Cardiac: RRR, no rubs or gallops. 2/6 systolic murmur. No clubbing, cyanosis, edema.  Radials/DP/PT 2+ and equal bilaterally.  Respiratory:  Respirations regular and unlabored, clear to auscultation bilaterally. GI: Soft, nontender, nondistended. MS: No deformity or atrophy. Skin: Warm and dry, no rash. Neuro: Strength and sensation are intact. Psych: Normal affect.  Accessory Clinical Findings    Recent Labs: 08/07/2021: ALT 19; TSH 1.87 11/02/2021: Hemoglobin 13.9; Platelets 249 02/19/2022: BUN 20; Creatinine, Ser 1.18; Potassium 4.3; Sodium 141   Recent Lipid Panel    Component Value Date/Time   CHOL 181 08/07/2021 0905   TRIG 72.0 08/07/2021 0905   HDL 49.70 08/07/2021 0905   CHOLHDL 4 08/07/2021 0905   VLDL 14.4 08/07/2021 0905   LDLCALC 117 (H) 08/07/2021 0905   LDLDIRECT 188.9 02/12/2012 0921    HYPERTENSION CONTROL Vitals:   03/29/22 1101 03/29/22 1600  BP: (!) 192/54 (!) 160/56    The patient's blood pressure is elevated above target today.  In order to address the patient's elevated BP: Blood pressure will be monitored at home to determine if medication changes need to be made.    ECG is not indicated today.  Assessment & Plan   Nonobstructive CAD. Abnormal nuclear stress test December 2023. Coronary CT with FFR showed no  significant stenosis. Patient denies chest pain, pressure or tightness.  Continue Zetia and metoprolol.  Mild to moderate AS and moderate AI. S/p history of aortic valve endocarditis October 2023 treated with 6 weeks of IV antibiotic via PICC  line. Echo performed at outside facility December 2023 EF 70%, no evidence of vegetation on aortic valve.  Aortic valve bicuspid.  Mild aortic valve stenosis, moderate AR, mild to moderate AAS and moderate AI.  Patient denies shortness of breath, lower extremity edema, DOE, orthopnea, or PND. Hypertension.  BP today 192/54 at intake 160/56 on recheck.  Patient reports whitecoat hypertension.  He provides a log from home with BP generally in the 140s over 60s.  He denies headaches or dizziness.  He states SBP lower than 130 will cause him to feel dizzy.  Continue amlodipine, losartan, metoprolol. Hyperlipidemia.  LDL June 2023 117, not at goal.  Patient is intolerant to statins.  He is only taking Zetia twice a week, as that was all he can tolerate.  He states that he has "tried them all."  Will refer patient to Pharm.D. lipid clinic. Continue Zetia for the time being. Need for SBE prophylaxis.  Patient prescribed amoxicillin for SBE prophylaxis.  Discussed how this should be taken prior to dental appointments.  He expressed understanding.   Disposition: Refer to Pharm.D. lipid clinic.  Return in 6 months with Dr. Gwenlyn Found or sooner as needed.   Justice [place]. Donald Memoli, DNP, NP-C     03/29/2022, 5:18 PM [place] [address] Office [phone] Fax [phone]

## 2022-03-29 ENCOUNTER — Encounter: Payer: Self-pay | Admitting: Adult Health

## 2022-03-29 ENCOUNTER — Ambulatory Visit: Payer: Medicare PPO | Attending: Adult Health | Admitting: Student

## 2022-03-29 VITALS — BP 160/56 | HR 73 | Ht 68.0 in | Wt 166.4 lb

## 2022-03-29 DIAGNOSIS — I2510 Atherosclerotic heart disease of native coronary artery without angina pectoris: Secondary | ICD-10-CM

## 2022-03-29 DIAGNOSIS — E785 Hyperlipidemia, unspecified: Secondary | ICD-10-CM | POA: Diagnosis not present

## 2022-03-29 DIAGNOSIS — Z2989 Encounter for other specified prophylactic measures: Secondary | ICD-10-CM | POA: Diagnosis not present

## 2022-03-29 DIAGNOSIS — I350 Nonrheumatic aortic (valve) stenosis: Secondary | ICD-10-CM

## 2022-03-29 DIAGNOSIS — Z8679 Personal history of other diseases of the circulatory system: Secondary | ICD-10-CM

## 2022-03-29 DIAGNOSIS — I10 Essential (primary) hypertension: Secondary | ICD-10-CM

## 2022-03-29 NOTE — Patient Instructions (Signed)
Medication Instructions:  No changes *If you need a refill on your cardiac medications before your next appointment, please call your pharmacy*   Lab Work: No labs If you have labs (blood work) drawn today and your tests are completely normal, you will receive your results only by: [place] (if you have MyChart) OR A paper copy in the mail If you have any lab test that is abnormal or we need to change your treatment, we will call you to review the results.   Testing/Procedures: No Testing   Follow-Up: At [facility], you and your health needs are our priority.  As part of our continuing mission to provide you with exceptional heart care, we have created designated Provider Care Teams.  These Care Teams include your primary Cardiologist (physician) and Advanced Practice Providers (APPs -  Physician Assistants and Nurse Practitioners) who all work together to provide you with the care you need, when you need it.  We recommend signing up for the patient portal called "MyChart".  Sign up information is provided on this After Visit Summary.  MyChart is used to connect with patients for Virtual Visits (Telemedicine).  Patients are able to view lab/test results, encounter notes, upcoming appointments, etc.  Non-urgent messages can be sent to your provider as well.   To learn more about what you can do with MyChart, go to [email or web address].    Your next appointment:   6 month(s)  Provider:   Quay Burow, MD or App

## 2022-04-24 DIAGNOSIS — Z961 Presence of intraocular lens: Secondary | ICD-10-CM | POA: Diagnosis not present

## 2022-04-24 DIAGNOSIS — H31092 Other chorioretinal scars, left eye: Secondary | ICD-10-CM | POA: Diagnosis not present

## 2022-04-24 DIAGNOSIS — H40053 Ocular hypertension, bilateral: Secondary | ICD-10-CM | POA: Diagnosis not present

## 2022-04-24 DIAGNOSIS — H43813 Vitreous degeneration, bilateral: Secondary | ICD-10-CM | POA: Diagnosis not present

## 2022-05-02 ENCOUNTER — Ambulatory Visit: Payer: Medicare PPO | Attending: Cardiology | Admitting: Pharmacist Clinician (PhC)/ Clinical Pharmacy Specialist

## 2022-05-02 ENCOUNTER — Encounter: Payer: Self-pay | Admitting: Pharmacist Clinician (PhC)/ Clinical Pharmacy Specialist

## 2022-05-02 DIAGNOSIS — E782 Mixed hyperlipidemia: Secondary | ICD-10-CM | POA: Diagnosis not present

## 2022-05-02 NOTE — Patient Instructions (Addendum)
Your Results:             Your most recent labs Goal  Total Cholesterol 181 < 200  Triglycerides 72 < 150  HDL (happy/good cholesterol) 49.7 > 40  LDL (lousy/bad cholesterol 117 < 70   Medication changes:  We will start the process to get Repatha covered by your insurance.  Once the prior authorization is complete, I will call/send a MyChart message to let you know and confirm pharmacy information.   You will take one injection every 14 days.    Lab orders:  We want to repeat labs after 2-3 months.  We will send you a lab order to remind you once we get closer to that time.    If you have more updated cholesterol labs at home, please reach out to me.  Haylen Bellotti at [phone].  If your last labs were from last June, we may need to do a more updated lipid panel for the insurance company.  Patient Assistance:  The [employer] Well foundation offers assistance to help pay for medication copays.  They will cover copays for all cholesterol lowering meds, including statins, fibrates, omega-3 oils, ezetimibe, Repatha, Praluent, Nexletol, Nexlizet.  The cards are usually good for $2,500 or 12 months, whichever comes first. Go to healthwellfoundation.org Click on "Apply Now" Answer questions as to whom is applying (patient or representative) Your disease fund will be "hypercholesterolemia - Medicare access" Select the cholesterol medication you need assistance with (Repatha, Praluent, Nexlizet...) They will ask question about qualifying diagnosis - you can mark "yes"; and do you have insurance coverage.   When they ask what type of assistance you are interested in - "copay assistance" When you submit, the approval is usually within minutes.  You will need to print the card information from the site You will need to show this information to your pharmacy, they will bill your Medicare Part D plan first -then bill [employer] Well --for the copay.   You can also call them at [phone], although the hold times  can be quite long.   Thank you for choosing [facility]

## 2022-05-02 NOTE — Progress Notes (Signed)
Office Visit    Patient Name: Chad KOWAL Sr. Date of Encounter: 05/02/2022  Primary Care Provider:  Dorothyann Peng, NP Primary Cardiologist:  Quay Burow, MD  Chief Complaint    Hyperlipidemia   Significant Past Medical History   CAD CAC = 28 (71st percentile) 25-49% prox LAD, 25-49% proximal RCA and 50-69% distal RCA  HTN States dizziness if systolic < [id number]  Aortic valve endocarditis Nees SBE for dental work     Allergies  Allergen Reactions   Codeine     REACTION: N\T\ V   Influenza Virus Vaccine Other (See Comments)   Statins Diarrhea    Pt stated, "this gave me uncontrollable bowels"   Sulfonamide Derivatives     REACTION: rash    History of Present Illness    Chad Yoder Sr. is a 77 y.o. male patient of Dr Gwenlyn Found, in the office today to discuss cholesterol management.  In January patient had coronary CT which showed no significant stenosis, but a CAC score of 734.  He has been intolerant of statins and notes can only tolerate ezetimibe twice weekly  Insurance Carrier:  [employer] [phone]  $350 deductible  Repatha $47/month    Coverage gap $118/month  LDL Cholesterol goal:  LDL < 70  Current Medications: ezetimibe 10 mg twice weekly    Previously tried:  simvastatin, atorvastatin  Family Hx: brother also with high cholesterol, hypertension runs in family - parents, brother; deaths were mostly cancer in the family; unknown if children have  Social Hx: Tobacco: no Alcohol: rare  Diet:    mix of home and eating out - sandwich shops; no added salts; does like fried foods, loves cheeseburgers; snacks some during the day; some vegetables,   Exercise: walks 30 min/day every day  Adherence Assessment  Do you ever forget to take your medication? '[]'$ Yes '[x]'$ No  Do you ever skip doses due to side effects? '[]'$ Yes '[x]'$ No  Do you have trouble affording your medicines? '[]'$ Yes '[x]'$ No  Are you ever unable to pick up your medication due to transportation  difficulties? '[]'$ Yes '[x]'$ No  Do you ever stop taking your medications because you don't believe they are helping? '[]'$ Yes '[x]'$ No   Adherence strategy: none, follows same home routine   Accessory Clinical Findings   Lab Results  Component Value Date   CHOL 181 08/07/2021   HDL 49.70 08/07/2021   LDLCALC 117 (H) 08/07/2021   LDLDIRECT 188.9 02/12/2012   TRIG 72.0 08/07/2021   CHOLHDL 4 08/07/2021    Lab Results  Component Value Date   ALT 19 08/07/2021   AST 20 08/07/2021   ALKPHOS 67 08/07/2021   BILITOT 1.4 (H) 08/07/2021   Lab Results  Component Value Date   CREATININE 1.18 02/19/2022   BUN 20 02/19/2022   NA 141 02/19/2022   K 4.3 02/19/2022   CL 106 02/19/2022   CO2 24 02/19/2022   Lab Results  Component Value Date   HGBA1C 5.2 08/07/2021    Home Medications    Current Outpatient Medications  Medication Sig Dispense Refill   amLODipine (NORVASC) 2.5 MG tablet Take 1 tablet (2.5 mg total) by mouth daily. 90 tablet 3   amoxicillin (AMOXIL) 500 MG tablet Take 4 tablets (2,000 mg total) by mouth as needed. Take 30 to 60 minutes prior to dental procedures. 4 tablet 4   aspirin EC 81 MG tablet Take 81 mg by mouth daily.     desloratadine (CLARINEX) 5 MG tablet Take 5 mg  by mouth as needed.     ezetimibe (ZETIA) 10 MG tablet Take 1 tablet (10 mg total) by mouth daily. 90 tablet 3   finasteride (PROSCAR) 5 MG tablet Take 5 mg by mouth daily.     Ibuprofen 200 MG CAPS Take 200 mg by mouth daily as needed.     ipratropium (ATROVENT) 0.03 % nasal spray Place 2 sprays into both nostrils 3 (three) times daily as needed.     latanoprost (XALATAN) 0.005 % ophthalmic solution Place 1 drop into both eyes at bedtime.     losartan (COZAAR) 100 MG tablet Take 100 mg by mouth daily.     metoprolol tartrate (LOPRESSOR) 50 MG tablet Take 1 tablet (50 mg total) by mouth daily. Take 50 mg by mouth. 90 tablet 3   Multiple Vitamins-Minerals (MENS 50+ ADVANCED PO) Take 1 tablet by mouth  daily.     PROVENTIL HFA 108 (90 Base) MCG/ACT inhaler Inhale 2 puffs into the lungs every 6 (six) hours as needed.     triamcinolone cream (KENALOG) 0.1 % Apply topically 2 (two) times daily. 30 g 3   No current facility-administered medications for this visit.     Assessment & Plan    Hyperlipidemia Assessment: Patient with ASCVD not at LDL goal of < 70 Most recent LDL 117 on 08/24/21, on ezetimibe 10 mg twice weekly Not able to tolerate statins secondary to severe diarrhea Reviewed options for lowering LDL cholesterol, including PCSK-9 inhibitors, bempedoic acid and inclisiran.  Discussed mechanisms of action, dosing, side effects, potential decreases in LDL cholesterol and costs.  Also reviewed potential options for patient assistance.  Plan: Will need updated labs, most recent are now 19 months old Patient agreeable to starting Repatha 140 mg q14d if LDL > 70 Repeat labs after:  3 months after starting med Lipid Liver function Patient was given information on [place] grant/signed up for [employer] while in office today.   Tommy Medal, PharmD CPP [facility] [address] [place]  [place], [place] [address] [phone]  05/02/2022, 11:44 AM

## 2022-05-02 NOTE — Assessment & Plan Note (Addendum)
Assessment: Patient with ASCVD not at LDL goal of < 70 Most recent LDL 117 on 08/24/21, on ezetimibe 10 mg twice weekly Not able to tolerate statins secondary to severe diarrhea Reviewed options for lowering LDL cholesterol, including PCSK-9 inhibitors, bempedoic acid and inclisiran.  Discussed mechanisms of action, dosing, side effects, potential decreases in LDL cholesterol and costs.  Also reviewed potential options for patient assistance.  Plan: Will need updated labs, most recent are now 42 months old Patient agreeable to starting Repatha 140 mg q14d if LDL > 70 Repeat labs after:  3 months after starting med Lipid Liver function Patient was given information on [employer] grant/signed up for [employer] while in office today.

## 2022-05-03 ENCOUNTER — Other Ambulatory Visit: Payer: Self-pay

## 2022-05-03 DIAGNOSIS — E782 Mixed hyperlipidemia: Secondary | ICD-10-CM

## 2022-05-04 LAB — LIPID PANEL
Chol/HDL Ratio: 4.1 ratio (ref 0.0–5.0)
Cholesterol, Total: 178 mg/dL (ref 100–199)
HDL: 43 mg/dL (ref >39)
LDL Chol Calc (NIH): 122 mg/dL — ABNORMAL HIGH (ref 0–99)
Triglycerides: 71 mg/dL (ref 0–149)
VLDL Cholesterol Cal: 13 mg/dL (ref 5–40)

## 2022-05-06 ENCOUNTER — Telehealth: Payer: Self-pay | Admitting: Pharmacist Clinician (PhC)/ Clinical Pharmacy Specialist

## 2022-05-06 ENCOUNTER — Other Ambulatory Visit (HOSPITAL_COMMUNITY): Payer: Self-pay

## 2022-05-06 ENCOUNTER — Telehealth: Payer: Self-pay

## 2022-05-06 NOTE — Telephone Encounter (Signed)
Please start PA for Repatha.  Labs drawn last week.

## 2022-05-06 NOTE — Telephone Encounter (Signed)
Pharmacy Patient Advocate Encounter   Received notification from [facility] that prior authorization for REPATHA 140 MG/ML INJ is needed.    PA submitted on 05/06/22 Key [id number] Status is pending  Karie Soda, [place] Patient Advocate Specialist Direct Number: [phone] Fax: [phone]

## 2022-05-06 NOTE — Telephone Encounter (Signed)
Pharmacy Patient Advocate Encounter  Prior Authorization for REPATHA 140 MG/ML INJ has been approved.    Effective dates: 02/25/22 through 02/25/23  Aric Jost, CPhT Pharmacy Patient Advocate Specialist Direct Number: [phone] Fax: [phone] 

## 2022-05-07 MED ORDER — REPATHA SURECLICK 140 MG/ML ~~LOC~~ SOAJ: 140.0000 mg | SUBCUTANEOUS | 3 refills | Status: DC
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
## 2022-05-07 NOTE — Addendum Note (Signed)
Addended by: Rockne Menghini on: 05/07/2022 03:20 PM   Modules accepted: Orders

## 2022-05-16 DIAGNOSIS — R062 Wheezing: Secondary | ICD-10-CM | POA: Diagnosis not present

## 2022-05-16 DIAGNOSIS — J3089 Other allergic rhinitis: Secondary | ICD-10-CM | POA: Diagnosis not present

## 2022-05-16 DIAGNOSIS — J301 Allergic rhinitis due to pollen: Secondary | ICD-10-CM | POA: Diagnosis not present

## 2022-05-16 DIAGNOSIS — J3081 Allergic rhinitis due to animal (cat) (dog) hair and dander: Secondary | ICD-10-CM | POA: Diagnosis not present

## 2022-05-27 DIAGNOSIS — R3911 Hesitancy of micturition: Secondary | ICD-10-CM | POA: Diagnosis not present

## 2022-05-27 DIAGNOSIS — N202 Calculus of kidney with calculus of ureter: Secondary | ICD-10-CM | POA: Diagnosis not present

## 2022-05-27 DIAGNOSIS — N481 Balanitis: Secondary | ICD-10-CM | POA: Diagnosis not present

## 2022-07-08 DIAGNOSIS — L812 Freckles: Secondary | ICD-10-CM | POA: Diagnosis not present

## 2022-07-08 DIAGNOSIS — D225 Melanocytic nevi of trunk: Secondary | ICD-10-CM | POA: Diagnosis not present

## 2022-07-08 DIAGNOSIS — L821 Other seborrheic keratosis: Secondary | ICD-10-CM | POA: Diagnosis not present

## 2022-07-08 DIAGNOSIS — D3617 Benign neoplasm of peripheral nerves and autonomic nervous system of trunk, unspecified: Secondary | ICD-10-CM | POA: Diagnosis not present

## 2022-07-08 DIAGNOSIS — M6740 Ganglion, unspecified site: Secondary | ICD-10-CM | POA: Diagnosis not present

## 2022-07-08 DIAGNOSIS — Z85828 Personal history of other malignant neoplasm of skin: Secondary | ICD-10-CM | POA: Diagnosis not present

## 2022-07-08 DIAGNOSIS — L570 Actinic keratosis: Secondary | ICD-10-CM | POA: Diagnosis not present

## 2022-07-26 ENCOUNTER — Ambulatory Visit: Payer: Medicare PPO | Admitting: Family Medicine

## 2022-07-26 ENCOUNTER — Encounter: Payer: Self-pay | Admitting: Family Medicine

## 2022-07-26 VITALS — BP 148/62 | HR 80 | Temp 98.6°F | Wt 166.8 lb

## 2022-07-26 DIAGNOSIS — J4541 Moderate persistent asthma with (acute) exacerbation: Secondary | ICD-10-CM

## 2022-07-26 DIAGNOSIS — R011 Cardiac murmur, unspecified: Secondary | ICD-10-CM

## 2022-07-26 DIAGNOSIS — J0100 Acute maxillary sinusitis, unspecified: Secondary | ICD-10-CM | POA: Diagnosis not present

## 2022-07-26 DIAGNOSIS — H66002 Acute suppurative otitis media without spontaneous rupture of ear drum, left ear: Secondary | ICD-10-CM | POA: Diagnosis not present

## 2022-07-26 MED ORDER — AMOXICILLIN-POT CLAVULANATE 500-125 MG PO TABS: 1.0000 | ORAL_TABLET | Freq: Two times a day (BID) | ORAL | 0 refills | Status: AC

## 2022-07-26 NOTE — Progress Notes (Signed)
Established Patient Office Visit   Subjective  Patient ID: Chad Magnone., Chad Yoder    DOB: [birth year]  Age: 77 y.o. MRN: [id number]  Chief Complaint  Patient presents with   Asthma    Had an asthma attack last Thursday. Started 96 month old pred, got better until this week. SOB, sinus- yellow mucus, right nostril has some blood. Cough, productive yesterday. SOB and wheezing was bad yesterday when out walking. Just not getting 100% better. Also used his albuterol inhaler.     Patient is a 77 year old Chad Yoder followed by Shirline Frees, NP and seen for acute concern.  Patient endorses asthma attack last week.  Started using albuterol inhaler and took an expired prednisone taper prescription.  Symptoms continued and patient developed facial pain/pressure, shortness of breath, nasal drainage, intermittent ear pressure.  Patient denies fever, chills, headaches, sore throat, nausea, vomiting.    Past Medical History:  Diagnosis Date   AR (allergic rhinitis)    Bilateral club feet    surgery and slinting as child   BPH (benign prostatic hyperplasia)    FUNGAL DERMATITIS 11/30/2009   HYPERTENSION 01/28/2007   Kidney stone    06/2009   PILONIDAL CYST, WITH ABSCESS 08/06/2007   Past Surgical History:  Procedure Laterality Date   CLUB FOOT RELEASE     bilat - as a child   Social History   Tobacco Use   Smoking status: Never   Smokeless tobacco: Never  Vaping Use   Vaping Use: Never used  Substance Use Topics   Alcohol use: Yes    Comment: rarley    Drug use: Never   Family History  Problem Relation Age of Onset   Cancer Neg Hx        colon ca   Hypertension Neg Hx        familyl hx   Allergies  Allergen Reactions   Codeine     REACTION: N\T\ V   Influenza Virus Vaccine Other (See Comments)   Statins Diarrhea    Pt stated, "this gave me uncontrollable bowels"   Sulfonamide Derivatives     REACTION: rash      ROS Negative unless stated above    Objective:     BP  (!) 148/62 (BP Location: Right Arm, Patient Position: Sitting, Cuff Size: Normal)   Pulse 80   Temp 98.6 F (37 C) (Oral)   Wt 166 lb 12.8 oz (75.7 kg)   SpO2 97%   BMI 25.36 kg/m     Physical Exam Constitutional:      General: He is not in acute distress.    Appearance: Normal appearance.  HENT:     Head: Normocephalic and atraumatic.     Nose: Congestion present.     Mouth/Throat:     Mouth: Mucous membranes are moist.  Cardiovascular:     Rate and Rhythm: Normal rate and regular rhythm.     Heart sounds: Murmur heard.     Systolic murmur is present with a grade of 3/6.     No gallop.     Comments: Cardiac murmur best heard right upper chest. Pulmonary:     Effort: Pulmonary effort is normal. No respiratory distress.     Breath sounds: Examination of the left-lower field reveals wheezing. Wheezing present. No rhonchi or rales.  Skin:    General: Skin is warm and dry.  Neurological:     Mental Status: He is alert and oriented to person, place, and time.  No results found for any visits on 07/26/22.    Assessment & Plan:  Acute maxillary sinusitis, recurrence not specified -     Amoxicillin-Pot Clavulanate; Take 1 tablet by mouth in the morning and at bedtime for 7 days.  Dispense: 14 tablet; Refill: 0  Acute suppurative otitis media of left ear without spontaneous rupture of tympanic membrane, recurrence not specified -     Amoxicillin-Pot Clavulanate; Take 1 tablet by mouth in the morning and at bedtime for 7 days.  Dispense: 14 tablet; Refill: 0  Moderate persistent asthma with exacerbation  Murmur  Start ABX for left AOM and acute sinusitis.  Continue supportive care.  Patient to complete course of expired prednisone.  Will wait on additional prednisone.  Albuterol inhaler as needed. Given strict precautions.  Continue follow-up with cardiology for murmur.  Return if symptoms worsen or fail to improve.   Deeann Saint, MD

## 2022-08-09 ENCOUNTER — Encounter: Payer: Self-pay | Admitting: Adult Health

## 2022-08-09 ENCOUNTER — Ambulatory Visit (INDEPENDENT_AMBULATORY_CARE_PROVIDER_SITE_OTHER): Payer: Medicare PPO | Admitting: Adult Health

## 2022-08-09 VITALS — BP 136/54 | HR 65 | Temp 98.0°F | Ht 68.0 in | Wt 167.0 lb

## 2022-08-09 DIAGNOSIS — E785 Hyperlipidemia, unspecified: Secondary | ICD-10-CM | POA: Diagnosis not present

## 2022-08-09 DIAGNOSIS — Z0000 Encounter for general adult medical examination without abnormal findings: Secondary | ICD-10-CM

## 2022-08-09 DIAGNOSIS — M5441 Lumbago with sciatica, right side: Secondary | ICD-10-CM

## 2022-08-09 DIAGNOSIS — N400 Enlarged prostate without lower urinary tract symptoms: Secondary | ICD-10-CM

## 2022-08-09 DIAGNOSIS — I10 Essential (primary) hypertension: Secondary | ICD-10-CM

## 2022-08-09 LAB — CBC
HCT: 40.1 % (ref 39.0–52.0)
Hemoglobin: 13.6 g/dL (ref 13.0–17.0)
MCHC: 34 g/dL (ref 30.0–36.0)
MCV: 86.7 fl (ref 78.0–100.0)
Platelets: 232 10*3/uL (ref 150.0–400.0)
RBC: 4.62 Mil/uL (ref 4.22–5.81)
RDW: 13.1 % (ref 11.5–15.5)
WBC: 8.1 10*3/uL (ref 4.0–10.5)

## 2022-08-09 LAB — COMPREHENSIVE METABOLIC PANEL
ALT: 15 U/L (ref 0–53)
AST: 17 U/L (ref 0–37)
Albumin: 4 g/dL (ref 3.5–5.2)
Alkaline Phosphatase: 60 U/L (ref 39–117)
BUN: 17 mg/dL (ref 6–23)
CO2: 28 mEq/L (ref 19–32)
Calcium: 9 mg/dL (ref 8.4–10.5)
Chloride: 104 mEq/L (ref 96–112)
Creatinine, Ser: 1.1 mg/dL (ref 0.40–1.50)
GFR: 65.14 mL/min (ref >60.00)
Glucose, Bld: 95 mg/dL (ref 70–99)
Potassium: 4.3 mEq/L (ref 3.5–5.1)
Sodium: 140 mEq/L (ref 135–145)
Total Bilirubin: 1.1 mg/dL (ref 0.2–1.2)
Total Protein: 6 g/dL (ref 6.0–8.3)

## 2022-08-09 LAB — PSA: PSA: 0.87 ng/mL (ref 0.10–4.00)

## 2022-08-09 LAB — LIPID PANEL
Cholesterol: 114 mg/dL (ref 0–200)
HDL: 45.3 mg/dL (ref >39.00)
LDL Cholesterol: 52 mg/dL (ref 0–99)
NonHDL: 68.36
Total CHOL/HDL Ratio: 3
Triglycerides: 83 mg/dL (ref 0.0–149.0)
VLDL: 16.6 mg/dL (ref 0.0–40.0)

## 2022-08-09 LAB — TSH: TSH: 2.73 u[IU]/mL (ref 0.35–5.50)

## 2022-08-09 MED ORDER — METHYLPREDNISOLONE 4 MG PO TBPK: ORAL_TABLET | ORAL | 0 refills | Status: DC

## 2022-08-09 MED ORDER — LOSARTAN POTASSIUM 100 MG PO TABS: 100.0000 mg | ORAL_TABLET | Freq: Every day | ORAL | 3 refills | Status: DC

## 2022-08-09 NOTE — Patient Instructions (Signed)
It was great seeing you today   We will follow up with you regarding your lab work   Please let me know if you need anything   

## 2022-08-09 NOTE — Addendum Note (Signed)
Addended by: Nancy Fetter on: 08/09/2022 08:55 AM   Modules accepted: Level of Service

## 2022-08-09 NOTE — Progress Notes (Signed)
Subjective:    Patient ID: Chad Yoder., male    DOB: 1945-09-24, 77 y.o.   MRN: [id number]  HPI Patient presents for yearly preventative medicine examination. He is a pleasant 77 year old male who  has a past medical history of AR (allergic rhinitis), Bilateral club feet, BPH (benign prostatic hyperplasia), FUNGAL DERMATITIS (11/30/2009), HYPERTENSION (01/28/2007), Kidney stone, and PILONIDAL CYST, WITH ABSCESS (08/06/2007).  Hypertension-managed with Cozaar 100 mg daily, Norvasc 2.5 mg daily, and Lopressor 50 mg daily.    He does monitor his blood pressure at home periodically and reports readings in the 130s to 140s over 70s to 80s.  He denies chest pain, shortness of breath, dizziness, lightheadedness, or blurred vision BP Readings from Last 3 Encounters:  08/09/22 (!) 136/54  07/26/22 (!) 148/62  03/29/22 (!) 160/56   Asthma-mild/intermittent.  Uses an albuterol inhaler as needed.  He is seen by [facility]  BPH-managed by [facility].  Prescribed finasteride 5 mg daily and Flomax 0.4 mg daily PRN.   Hyperlipidemia-has been statin intolerant to multiple statins.  He had diarrhea from each one of them.  Currently prescribed Zetia 10 mg daily. He reports that he stopped taking the Zetia after two months because it was causing him to have " heavy breathing". This resolved within 5 days or stopping the medication and he started taking it 1-2 times a week without issues. Most recently was placed on Repatha by Cardiology; he is tolerating this medication well.  Lab Results  Component Value Date   CHOL 178 05/03/2022   HDL 43 05/03/2022   LDLCALC 122 (H) 05/03/2022   LDLDIRECT 188.9 02/12/2012   TRIG 71 05/03/2022   CHOLHDL 4.1 05/03/2022   Acute Issues  He reports that for the last few days he has been experiencing right sided low back pain with pain radiating down the outside of his right leg to his knee. Pain goes away with sitting. He had similar  symptoms back in September and was prescribed a medrol dose pack and flexeril. This helped but he did not like the side effects of Flexeril.   All immunizations and health maintenance protocols were reviewed with the patient and needed orders were placed.  Appropriate screening laboratory values were ordered for the patient including screening of hyperlipidemia, renal function and hepatic function. If indicated by BPH, a PSA was ordered.  Medication reconciliation,  past medical history, social history, problem list and allergies were reviewed in detail with the patient  Goals were established with regard to weight loss, exercise, and  diet in compliance with medications. He is walking two miles a day.    Review of Systems  Constitutional: Negative.   HENT: Negative.    Eyes: Negative.   Respiratory: Negative.    Cardiovascular: Negative.   Gastrointestinal: Negative.   Endocrine: Negative.   Genitourinary: Negative.   Musculoskeletal:  Positive for myalgias.  Skin: Negative.   Allergic/Immunologic: Negative.   Neurological:  Positive for numbness.  Hematological: Negative.   Psychiatric/Behavioral: Negative.    All other systems reviewed and are negative.  Past Medical History:  Diagnosis Date   AR (allergic rhinitis)    Bilateral club feet    surgery and slinting as child   BPH (benign prostatic hyperplasia)    FUNGAL DERMATITIS 11/30/2009   HYPERTENSION 01/28/2007   Kidney stone    06/2009   PILONIDAL CYST, WITH ABSCESS 08/06/2007    Social History   Socioeconomic History  Marital status: Married    Spouse name: Not on file   Number of children: Not on file   Years of education: Not on file   Highest education level: Not on file  Occupational History   Not on file  Tobacco Use   Smoking status: Never   Smokeless tobacco: Never  Vaping Use   Vaping Use: Never used  Substance and Sexual Activity   Alcohol use: Yes    Comment: rarley    Drug use: Never    Sexual activity: Not on file  Other Topics Concern   Not on file  Social History Narrative   Not on file   Social Determinants of Health   Financial Resource Strain: Low Risk  (05/16/2020)   Overall Financial Resource Strain (CARDIA)    Difficulty of Paying Living Expenses: Not hard at all  Food Insecurity: No Food Insecurity (05/16/2020)   Hunger Vital Sign    Worried About Running Out of Food in the Last Year: Never true    Ran Out of Food in the Last Year: Never true  Transportation Needs: No Transportation Needs (05/16/2020)   PRAPARE - Administrator, Civil Service (Medical): No    Lack of Transportation (Non-Medical): No  Physical Activity: Sufficiently Active (05/16/2020)   Exercise Vital Sign    Days of Exercise per Week: 7 days    Minutes of Exercise per Session: 40 min  Stress: No Stress Concern Present (05/16/2020)   [employer] of Occupational Health - Occupational Stress Questionnaire    Feeling of Stress : Not at all  Social Connections: Moderately Isolated (05/16/2020)   Social Connection and Isolation Panel [NHANES]    Frequency of Communication with Friends and Family: More than three times a week    Frequency of Social Gatherings with Friends and Family: Once a week    Attends Religious Services: Never    Database administrator or Organizations: No    Attends Banker Meetings: Never    Marital Status: Married  Catering manager Violence: Not At Risk (05/16/2020)   Humiliation, Afraid, Rape, and Kick questionnaire    Fear of Current or Ex-Partner: No    Emotionally Abused: No    Physically Abused: No    Sexually Abused: No    Past Surgical History:  Procedure Laterality Date   CLUB FOOT RELEASE     bilat - as a child    Family History  Problem Relation Age of Onset   Cancer Neg Hx        colon ca   Hypertension Neg Hx        familyl hx    Allergies  Allergen Reactions   Codeine     REACTION: N\T\ V   Influenza Virus  Vaccine Other (See Comments)   Statins Diarrhea    Pt stated, "this gave me uncontrollable bowels"   Sulfonamide Derivatives     REACTION: rash    Current Outpatient Medications on File Prior to Visit  Medication Sig Dispense Refill   amLODipine (NORVASC) 2.5 MG tablet Take 1 tablet (2.5 mg total) by mouth daily. 90 tablet 3   amoxicillin (AMOXIL) 500 MG tablet Take 4 tablets (2,000 mg total) by mouth as needed. Take 30 to 60 minutes prior to dental procedures. 4 tablet 4   aspirin EC 81 MG tablet Take 81 mg by mouth daily.     desloratadine (CLARINEX) 5 MG tablet Take 5 mg by mouth as needed.  Evolocumab (REPATHA SURECLICK) 140 MG/ML SOAJ Inject 140 mg into the skin every 14 (fourteen) days. 6 mL 3   finasteride (PROSCAR) 5 MG tablet Take 5 mg by mouth daily.     Ibuprofen 200 MG CAPS Take 200 mg by mouth daily as needed.     ipratropium (ATROVENT) 0.03 % nasal spray Place 2 sprays into both nostrils 3 (three) times daily as needed.     latanoprost (XALATAN) 0.005 % ophthalmic solution Place 1 drop into both eyes at bedtime.     metoprolol tartrate (LOPRESSOR) 50 MG tablet Take 1 tablet (50 mg total) by mouth daily. Take 50 mg by mouth. 90 tablet 3   Multiple Vitamins-Minerals (MENS 50+ ADVANCED PO) Take 1 tablet by mouth daily.     PROVENTIL HFA 108 (90 Base) MCG/ACT inhaler Inhale 2 puffs into the lungs every 6 (six) hours as needed.     silodosin (RAPAFLO) 8 MG CAPS capsule Take 8 mg by mouth daily.     tamsulosin (FLOMAX) 0.4 MG CAPS capsule Take 0.4 mg by mouth daily as needed.     triamcinolone cream (KENALOG) 0.1 % Apply topically 2 (two) times daily. 30 g 3   No current facility-administered medications on file prior to visit.    BP (!) 136/54 Comment: home  Pulse 65   Temp 98 F (36.7 C) (Oral)   Ht 5\' 8"  (1.727 m)   Wt 167 lb (75.8 kg)   SpO2 98%   BMI 25.39 kg/m       Objective:   Physical Exam Vitals and nursing note reviewed.  Constitutional:      General:  He is not in acute distress.    Appearance: Normal appearance. He is not ill-appearing.  HENT:     Head: Normocephalic and atraumatic.     Right Ear: Tympanic membrane, ear canal and external ear normal. There is no impacted cerumen.     Left Ear: Tympanic membrane, ear canal and external ear normal. There is no impacted cerumen.     Nose: Nose normal. No congestion or rhinorrhea.     Mouth/Throat:     Mouth: Mucous membranes are moist.     Pharynx: Oropharynx is clear.  Eyes:     Extraocular Movements: Extraocular movements intact.     Conjunctiva/sclera: Conjunctivae normal.     Pupils: Pupils are equal, round, and reactive to light.  Neck:     Vascular: No carotid bruit.  Cardiovascular:     Rate and Rhythm: Normal rate and regular rhythm.     Pulses: Normal pulses.     Heart sounds: No murmur heard.    No friction rub. No gallop.  Pulmonary:     Effort: Pulmonary effort is normal.     Breath sounds: Normal breath sounds.  Abdominal:     General: Abdomen is flat. Bowel sounds are normal. There is no distension.     Palpations: Abdomen is soft. There is no mass.     Tenderness: There is no abdominal tenderness. There is no guarding or rebound.     Hernia: No hernia is present.  Musculoskeletal:        General: Tenderness present. Normal range of motion.     Cervical back: Normal range of motion and neck supple.  Lymphadenopathy:     Cervical: No cervical adenopathy.  Skin:    General: Skin is warm and dry.     Capillary Refill: Capillary refill takes less than 2 seconds.  Neurological:  General: No focal deficit present.     Mental Status: He is alert and oriented to person, place, and time.  Psychiatric:        Mood and Affect: Mood normal.        Behavior: Behavior normal.        Thought Content: Thought content normal.        Judgment: Judgment normal.        Assessment & Plan:  1. Routine general medical examination at a health care facility Today patient  counseled on age appropriate routine health concerns for screening and prevention, each reviewed and up to date or declined. Immunizations reviewed and up to date or declined. Labs ordered and reviewed. Risk factors for depression reviewed and negative. Hearing function and visual acuity are intact. ADLs screened and addressed as needed. Functional ability and level of safety reviewed and appropriate. Education, counseling and referrals performed based on assessed risks today. Patient provided with a copy of personalized plan for preventive services. - Follow up in one year or sooner if needed  2. Essential hypertension - elevated in the office today at 190/58. His home readings this morning was 136/58 - No change in medication  - Lipid panel; Future - TSH; Future - CBC; Future - Comprehensive metabolic panel; Future - losartan (COZAAR) 100 MG tablet; Take 1 tablet (100 mg total) by mouth daily.  Dispense: 90 tablet; Refill: 3  3. Benign prostatic hyperplasia without lower urinary tract symptoms  - PSA; Future  4. Hyperlipidemia, unspecified hyperlipidemia type - Per cardiology  - Lipid panel; Future - TSH; Future - CBC; Future - Comprehensive metabolic panel; Future  5. Acute right-sided low back pain with right-sided sciatica  - methylPREDNISolone (MEDROL DOSEPAK) 4 MG TBPK tablet; Take as directed  Dispense: 21 tablet; Refill: 0 - Follow up if not resolved after finishing steroid therapy   Shirline Frees, NP

## 2022-08-20 ENCOUNTER — Telehealth: Payer: Self-pay | Admitting: Adult Health

## 2022-08-20 NOTE — Telephone Encounter (Signed)
Pt has been scheduled.  °

## 2022-08-20 NOTE — Telephone Encounter (Signed)
Please advise per note from 6/14:  "  Acute right-sided low back pain with right-sided sciatica   - methylPREDNISolone (MEDROL DOSEPAK) 4 MG TBPK tablet; Take as directed  Dispense: 21 tablet; Refill: 0 - Follow up if not resolved after finishing steroid therapy    Shirline Frees, NP"

## 2022-08-20 NOTE — Telephone Encounter (Signed)
Pt call and stated his sciatica problem is back and he want to know can [facility] call him in another round of prednisone . Pt stated he want a call back.

## 2022-08-22 ENCOUNTER — Ambulatory Visit (INDEPENDENT_AMBULATORY_CARE_PROVIDER_SITE_OTHER): Payer: Medicare PPO

## 2022-08-22 ENCOUNTER — Ambulatory Visit: Payer: Medicare PPO | Admitting: Adult Health

## 2022-08-22 VITALS — BP 170/50 | HR 80 | Temp 97.8°F | Ht 68.0 in | Wt 163.0 lb

## 2022-08-22 DIAGNOSIS — M1611 Unilateral primary osteoarthritis, right hip: Secondary | ICD-10-CM | POA: Diagnosis not present

## 2022-08-22 DIAGNOSIS — M5126 Other intervertebral disc displacement, lumbar region: Secondary | ICD-10-CM | POA: Diagnosis not present

## 2022-08-22 DIAGNOSIS — I10 Essential (primary) hypertension: Secondary | ICD-10-CM | POA: Diagnosis not present

## 2022-08-22 DIAGNOSIS — M25551 Pain in right hip: Secondary | ICD-10-CM

## 2022-08-22 DIAGNOSIS — M545 Low back pain, unspecified: Secondary | ICD-10-CM

## 2022-08-22 DIAGNOSIS — M5136 Other intervertebral disc degeneration, lumbar region: Secondary | ICD-10-CM | POA: Diagnosis not present

## 2022-08-22 DIAGNOSIS — M4186 Other forms of scoliosis, lumbar region: Secondary | ICD-10-CM | POA: Diagnosis not present

## 2022-08-22 DIAGNOSIS — M47816 Spondylosis without myelopathy or radiculopathy, lumbar region: Secondary | ICD-10-CM | POA: Diagnosis not present

## 2022-08-22 MED ORDER — PREDNISONE 10 MG PO TABS: ORAL_TABLET | ORAL | 0 refills | Status: DC

## 2022-08-22 MED ORDER — METHOCARBAMOL 500 MG PO TABS: 500.0000 mg | ORAL_TABLET | Freq: Three times a day (TID) | ORAL | 0 refills | Status: AC | PRN

## 2022-08-22 MED ORDER — LOSARTAN POTASSIUM 100 MG PO TABS: 100.0000 mg | ORAL_TABLET | Freq: Every day | ORAL | 3 refills | Status: DC

## 2022-08-22 NOTE — Progress Notes (Signed)
Subjective:    Patient ID: Chad Yoder., male    DOB: [birth year], 77 y.o.   MRN: [id number]  HPI 77 year old male who  has a past medical history of AR (allergic rhinitis), Bilateral club feet, BPH (benign prostatic hyperplasia), FUNGAL DERMATITIS (11/30/2009), HYPERTENSION (01/28/2007), Kidney stone, and PILONIDAL CYST, WITH ABSCESS (08/06/2007).  He presents to the office today for follow-up regarding low back pain with right-sided sciatica.  He was seen 2 weeks ago for this issue at which time he had pain in his right lower back with radiating pain down the outside of his right leg to his knee.  This pain went away with sitting.  Prior to that he had similar symptoms in September 2023 and was prescribed a Medrol Dosepak and Flexeril.  This combination helped in September but he did not like the side effects of Flexeril so 2 weeks ago we decided to just try Medrol Dosepak.  Today he reports that his pain is better but still lingering in his low back. He is no longer having sciatica pain. He has noticed that he can walk farther but when he gets to at least a mile his right hip will start hurting.   No imaging on lumbar spine has been done in the past   He also needs a refill of Losartan - he reports that his pharmacy did not have the refill that was sent in 2 weeks ago.   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   HYPERTENSION 01/28/2007   Kidney stone    06/2009   PILONIDAL CYST, WITH ABSCESS 08/06/2007    Social History   Socioeconomic History   Marital status: Married    Spouse name: Not on file   Number of children: Not on file   Years of education: Not on file   Highest education level: Not on file  Occupational History   Not on file  Tobacco Use   Smoking status: Never   Smokeless tobacco:  Never  Vaping Use   Vaping Use: Never used  Substance and Sexual Activity   Alcohol use: Yes    Comment: rarley    Drug use: Never   Sexual activity: Not on file  Other Topics Concern   Not on file  Social History Narrative   Not on file   Social Determinants of Health   Financial Resource Strain: Low Risk  (05/16/2020)   Overall Financial Resource Strain (CARDIA)    Difficulty of Paying Living Expenses: Not hard at all  Food Insecurity: No Food Insecurity (05/16/2020)   Hunger Vital Sign    Worried About Running Out of Food in the Last Year: Never true    Ran Out of Food in the Last Year: Never true  Transportation Needs: No Transportation Needs (05/16/2020)   PRAPARE - Administrator, Civil Service (Medical): No    Lack of Transportation (Non-Medical): No  Physical Activity: Sufficiently Active (05/16/2020)   Exercise Vital Sign    Days of Exercise per Week: 7 days    Minutes of Exercise per Session: 40 min  Stress: No Stress Concern Present (05/16/2020)   [employer] of Occupational Health - Occupational Stress Questionnaire    Feeling of Stress : Not at all  Social  Connections: Moderately Isolated (05/16/2020)   Social Connection and Isolation Panel [NHANES]    Frequency of Communication with Friends and Family: More than three times a week    Frequency of Social Gatherings with Friends and Family: Once a week    Attends Religious Services: Never    Database administrator or Organizations: No    Attends Banker Meetings: Never    Marital Status: Married  Catering manager Violence: Not At Risk (05/16/2020)   Humiliation, Afraid, Rape, and Kick questionnaire    Fear of Current or Ex-Partner: No    Emotionally Abused: No    Physically Abused: No    Sexually Abused: No    Past Surgical History:  Procedure Laterality Date   CLUB FOOT RELEASE     bilat - as a child    Family History  Problem Relation Age of Onset   Cancer Neg Hx         colon ca   Hypertension Neg Hx        familyl hx    Allergies  Allergen Reactions   Codeine     REACTION: N\T\ V   Influenza Virus Vaccine Other (See Comments)   Statins Diarrhea    Pt stated, "this gave me uncontrollable bowels"   Sulfonamide Derivatives     REACTION: rash    Current Outpatient Medications on File Prior to Visit  Medication Sig Dispense Refill   amLODipine (NORVASC) 2.5 MG tablet Take 1 tablet (2.5 mg total) by mouth daily. 90 tablet 3   amoxicillin (AMOXIL) 500 MG tablet Take 4 tablets (2,000 mg total) by mouth as needed. Take 30 to 60 minutes prior to dental procedures. 4 tablet 4   aspirin EC 81 MG tablet Take 81 mg by mouth daily.     desloratadine (CLARINEX) 5 MG tablet Take 5 mg by mouth as needed.     Evolocumab (REPATHA SURECLICK) 140 MG/ML SOAJ Inject 140 mg into the skin every 14 (fourteen) days. 6 mL 3   finasteride (PROSCAR) 5 MG tablet Take 5 mg by mouth daily.     Ibuprofen 200 MG CAPS Take 200 mg by mouth daily as needed.     ipratropium (ATROVENT) 0.03 % nasal spray Place 2 sprays into both nostrils 3 (three) times daily as needed.     latanoprost (XALATAN) 0.005 % ophthalmic solution Place 1 drop into both eyes at bedtime.     losartan (COZAAR) 100 MG tablet Take 1 tablet (100 mg total) by mouth daily. 90 tablet 3   methylPREDNISolone (MEDROL DOSEPAK) 4 MG TBPK tablet Take as directed 21 tablet 0   metoprolol tartrate (LOPRESSOR) 50 MG tablet Take 1 tablet (50 mg total) by mouth daily. Take 50 mg by mouth. 90 tablet 3   Multiple Vitamins-Minerals (MENS 50+ ADVANCED PO) Take 1 tablet by mouth daily.     PROVENTIL HFA 108 (90 Base) MCG/ACT inhaler Inhale 2 puffs into the lungs every 6 (six) hours as needed.     silodosin (RAPAFLO) 8 MG CAPS capsule Take 8 mg by mouth daily.     tamsulosin (FLOMAX) 0.4 MG CAPS capsule Take 0.4 mg by mouth daily as needed.     triamcinolone cream (KENALOG) 0.1 % Apply topically 2 (two) times daily. 30 g 3   No  current facility-administered medications on file prior to visit.    BP (!) 170/50   Pulse 80   Temp 97.8 F (36.6 C) (Oral)   Ht 5\' 8"  (  1.727 m)   Wt 163 lb (73.9 kg)   SpO2 98%   BMI 24.78 kg/m       Objective:   Physical Exam Vitals and nursing note reviewed.  Constitutional:      Appearance: Normal appearance.  Cardiovascular:     Rate and Rhythm: Normal rate and regular rhythm.     Pulses: Normal pulses.     Heart sounds: Normal heart sounds.  Pulmonary:     Effort: Pulmonary effort is normal.     Breath sounds: Normal breath sounds.  Musculoskeletal:     Lumbar back: Tenderness present. No swelling or bony tenderness. Normal range of motion.       Back:     Right hip: Normal.  Skin:    General: Skin is warm and dry.     Capillary Refill: Capillary refill takes less than 2 seconds.  Neurological:     General: No focal deficit present.     Mental Status: He is alert and oriented to person, place, and time.  Psychiatric:        Mood and Affect: Mood normal.        Behavior: Behavior normal.        Thought Content: Thought content normal.        Judgment: Judgment normal.        Assessment & Plan:  1. Acute right-sided low back pain without sciatica - Will treat with separate course of prednisone and Robaxin. Will also check lumbar spine xray but this seems to be more muscular.  - predniSONE (DELTASONE) 10 MG tablet; 40 mg x 3 days, 20 mg x 3 days, 10 mg x 3 days  Dispense: 21 tablet; Refill: 0 - methocarbamol (ROBAXIN) 500 MG tablet; Take 1 tablet (500 mg total) by mouth every 8 (eight) hours as needed for up to 10 days for muscle spasms.  Dispense: 20 tablet; Refill: 0 - DG Lumbar Spine Complete; Future  2. Essential hypertension  - losartan (COZAAR) 100 MG tablet; Take 1 tablet (100 mg total) by mouth daily.  Dispense: 90 tablet; Refill: 3  3. Right hip pain  - predniSONE (DELTASONE) 10 MG tablet; 40 mg x 3 days, 20 mg x 3 days, 10 mg x 3 days   Dispense: 21 tablet; Refill: 0 - methocarbamol (ROBAXIN) 500 MG tablet; Take 1 tablet (500 mg total) by mouth every 8 (eight) hours as needed for up to 10 days for muscle spasms.  Dispense: 20 tablet; Refill: 0 - DG Hip Unilat W OR W/O Pelvis 2-3 Views Right; Future   Shirline Frees, NP

## 2022-09-10 ENCOUNTER — Encounter: Payer: Self-pay | Admitting: Adult Health

## 2022-09-10 NOTE — Telephone Encounter (Signed)
Please advise 

## 2022-09-27 ENCOUNTER — Ambulatory Visit: Payer: Medicare PPO | Admitting: Cardiovascular Disease

## 2022-09-27 VITALS — BP 152/74 | HR 74 | Ht 68.0 in | Wt 165.5 lb

## 2022-09-27 DIAGNOSIS — E782 Mixed hyperlipidemia: Secondary | ICD-10-CM | POA: Diagnosis not present

## 2022-09-27 DIAGNOSIS — I2510 Atherosclerotic heart disease of native coronary artery without angina pectoris: Secondary | ICD-10-CM | POA: Diagnosis not present

## 2022-09-27 DIAGNOSIS — Q231 Congenital insufficiency of aortic valve: Secondary | ICD-10-CM | POA: Diagnosis not present

## 2022-09-27 DIAGNOSIS — I10 Essential (primary) hypertension: Secondary | ICD-10-CM

## 2022-09-27 MED ORDER — LOSARTAN POTASSIUM 100 MG PO TABS: 100.0000 mg | ORAL_TABLET | Freq: Every day | ORAL | 3 refills | Status: DC

## 2022-09-27 NOTE — Assessment & Plan Note (Signed)
History of hyperlipidemia on Repatha with lipid profile performed 08/09/2022 revealing total cholesterol 114, LDL of 52 and HDL 45.

## 2022-09-27 NOTE — Patient Instructions (Signed)
Medication Instructions:  Your physician recommends that you continue on your current medications as directed. Please refer to the Current Medication list given to you today.  *If you need a refill on your cardiac medications before your next appointment, please call your pharmacy*   Testing/Procedures: Your physician has requested that you have an echocardiogram. Echocardiography is a painless test that uses sound waves to create images of your heart. It provides your doctor with information about the size and shape of your heart and how well your heart's chambers and valves are working. This procedure takes approximately one hour. There are no restrictions for this procedure. Please do NOT wear cologne, perfume, aftershave, or lotions (deodorant is allowed). Please arrive 15 minutes prior to your appointment time.    Follow-Up: At [facility], you and your health needs are our priority.  As part of our continuing mission to provide you with exceptional heart care, we have created designated Provider Care Teams.  These Care Teams include your primary Cardiologist (physician) and Advanced Practice Providers (APPs -  Physician Assistants and Nurse Practitioners) who all work together to provide you with the care you need, when you need it.  We recommend signing up for the patient portal called "MyChart".  Sign up information is provided on this After Visit Summary.  MyChart is used to connect with patients for Virtual Visits (Telemedicine).  Patients are able to view lab/test results, encounter notes, upcoming appointments, etc.  Non-urgent messages can be sent to your provider as well.   To learn more about what you can do with MyChart, go to [email or web address].    Your next appointment:   12 month(s)  Provider:   Nanetta Batty, MD

## 2022-09-27 NOTE — Assessment & Plan Note (Signed)
History of essential hypertension blood pressure measured today at 153 2/74.  I reviewed his blood pressure log from home and his blood pressures have been under good control.  He is on amlodipine and losartan as well as metoprolol.

## 2022-09-27 NOTE — Assessment & Plan Note (Signed)
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  He is asymptomatic and does not have an outflow tract murmur.  He apparently does have mild to moderate aortic stenosis and AI with a valve area of 1.7 cm.  I am going to recheck a 2D echocardiogram.

## 2022-09-27 NOTE — Assessment & Plan Note (Signed)
History of CAD with a coronary calcium score of 734 performed 03/22/2022 with a negative FFR suggesting nonphysiologically significant disease.  He is at goal for secondary prevention.

## 2022-09-27 NOTE — Progress Notes (Signed)
09/27/2022 Chad Nephew Sr.   May 01, 1945  [id number]  Primary Physician Nafziger, Kandee Keen, NP Primary Cardiologist: Runell Gess MD Nicholes Calamity, [place]  HPI:  Chad Nephew Sr. is a 77 y.o.   thin-appearing married Caucasian male father 2 children, grandfather 2 grandchildren who was referred by Buddy Duty nurse practitioner because of recent history of endocarditis.  His brother Taniela Feltus is a patient of mine as well.  He is retired from being a Quarry manager at [employer].  I last saw him in the office 02/19/2022.  His cardiac risk factors are notable for treated hypertension and hyperlipidemia.  He is not diabetic.  There is no family history of heart disease .He has never had a heart tach or stroke.  He is fairly active and does yard work and walks for 30 minutes a day.  He apparently had a dental cleaning in mid October and was hospitalized at [facility] in [facility] 12/02/2021 for 8 days diagnosed with aortic valve endocarditis.  He was treated with IV antibiotics for 6 weeks via PICC line.  His most recent 2D echo performed 01/31/2022 revealed normal LV systolic function, bicuspid aortic valve with moderate AI and mild to moderate AS with an aortic valve area 1.7 cm.  He did have a stress test notably on 01/07/2022 that was abnormal with a reversible defect in the distal anteroapical wall although he is completely asymptomatic.  Since I saw him 7 months ago he did have a coronary calcium score of 734 with FFR analysis that showed no evidence of obstructive disease.  He is completely asymptomatic.   Current Meds  Medication Sig   amLODipine (NORVASC) 2.5 MG tablet Take 1 tablet (2.5 mg total) by mouth daily.   amoxicillin (AMOXIL) 500 MG tablet Take 4 tablets (2,000 mg total) by mouth as needed. Take 30 to 60 minutes prior to dental procedures.   aspirin EC 81 MG tablet Take 81 mg by mouth daily.   desloratadine (CLARINEX) 5 MG  tablet Take 5 mg by mouth as needed.   Evolocumab (REPATHA SURECLICK) 140 MG/ML SOAJ Inject 140 mg into the skin every 14 (fourteen) days.   finasteride (PROSCAR) 5 MG tablet Take 5 mg by mouth daily.   Ibuprofen 200 MG CAPS Take 200 mg by mouth daily as needed.   ipratropium (ATROVENT) 0.03 % nasal spray Place 2 sprays into both nostrils 3 (three) times daily as needed.   latanoprost (XALATAN) 0.005 % ophthalmic solution Place 1 drop into both eyes at bedtime.   methylPREDNISolone (MEDROL DOSEPAK) 4 MG TBPK tablet Take as directed   metoprolol tartrate (LOPRESSOR) 50 MG tablet Take 1 tablet (50 mg total) by mouth daily. Take 50 mg by mouth.   Multiple Vitamins-Minerals (MENS 50+ ADVANCED PO) Take 1 tablet by mouth daily.   predniSONE (DELTASONE) 10 MG tablet 40 mg x 3 days, 20 mg x 3 days, 10 mg x 3 days   PROVENTIL HFA 108 (90 Base) MCG/ACT inhaler Inhale 2 puffs into the lungs every 6 (six) hours as needed.   silodosin (RAPAFLO) 8 MG CAPS capsule Take 8 mg by mouth daily.   tamsulosin (FLOMAX) 0.4 MG CAPS capsule Take 0.4 mg by mouth daily as needed.   triamcinolone cream (KENALOG) 0.1 % Apply topically 2 (two) times daily.   [DISCONTINUED] losartan (COZAAR) 100 MG tablet Take 1 tablet (100 mg total) by mouth daily.     Allergies  Allergen Reactions  Codeine     REACTION: N\T\ V   Statins Diarrhea    Pt stated, "this gave me uncontrollable bowels"   Sulfonamide Derivatives     REACTION: rash    Social History   Socioeconomic History   Marital status: Married    Spouse name: Not on file   Number of children: Not on file   Years of education: Not on file   Highest education level: Not on file  Occupational History   Not on file  Tobacco Use   Smoking status: Never   Smokeless tobacco: Never  Vaping Use   Vaping status: Never Used  Substance and Sexual Activity   Alcohol use: Yes    Comment: rarley    Drug use: Never   Sexual activity: Not on file  Other Topics Concern    Not on file  Social History Narrative   Not on file   Social Determinants of Health   Financial Resource Strain: Low Risk  (05/16/2020)   Overall Financial Resource Strain (CARDIA)    Difficulty of Paying Living Expenses: Not hard at all  Food Insecurity: No Food Insecurity (05/16/2020)   Hunger Vital Sign    Worried About Running Out of Food in the Last Year: Never true    Ran Out of Food in the Last Year: Never true  Transportation Needs: No Transportation Needs (01/19/2022)   Received from [facility]   [employer] [phone]: Transportation    Lack of Transportation (Medical): No    Lack of Transportation (Non-Medical): No    Patient Unable or Declines to Respond: No  Physical Activity: Sufficiently Active (05/16/2020)   Exercise Vital Sign    Days of Exercise per Week: 7 days    Minutes of Exercise per Session: 40 min  Stress: No Stress Concern Present (05/16/2020)   [employer] of Occupational Health - Occupational Stress Questionnaire    Feeling of Stress : Not at all  Social Connections: Feeling Socially Integrated (01/19/2022)   Received from [facility], [facility]   [employer]ASIS [phone]: Social Isolation    Frequency of experiencing loneliness or isolation: Never  Intimate Partner Violence: Not At Risk (05/16/2020)   Humiliation, Afraid, Rape, and Kick questionnaire    Fear of Current or Ex-Partner: No    Emotionally Abused: No    Physically Abused: No    Sexually Abused: No     Review of Systems: General: negative for chills, fever, night sweats or weight changes.  Cardiovascular: negative for chest pain, dyspnea on exertion, edema, orthopnea, palpitations, paroxysmal nocturnal dyspnea or shortness of breath Dermatological: negative for rash Respiratory: negative for cough or wheezing Urologic: negative for hematuria Abdominal: negative for nausea, vomiting, diarrhea, bright red blood per rectum, melena, or hematemesis Neurologic: negative for visual  changes, syncope, or dizziness All other systems reviewed and are otherwise negative except as noted above.    Blood pressure (!) 152/74, pulse 74, height 5\' 8"  (1.727 m), weight 165 lb 8 oz (75.1 kg), SpO2 98%.  General appearance: alert and no distress Neck: no adenopathy, no carotid bruit, no JVD, supple, symmetrical, trachea midline, and thyroid not enlarged, symmetric, no tenderness/mass/nodules Lungs: clear to auscultation bilaterally Heart: Regular rate and rhythm without murmurs, rubs or clicks Extremities: extremities normal, atraumatic, no cyanosis or edema Pulses: 2+ and symmetric Skin: Skin color, texture, turgor normal. No rashes or lesions Neurologic: Grossly normal  EKG EKG Interpretation Date/Time:  Friday September 27 2022 08:11:50 EDT Ventricular Rate:  74 PR Interval:  160 QRS Duration:  90 QT Interval:  384 QTC Calculation: 426 R Axis:   -13  Text Interpretation: Normal sinus rhythm Normal ECG No previous ECGs available Confirmed by Nanetta Batty [phone]) on 09/27/2022 8:27:38 AM    ASSESSMENT AND PLAN:   Essential hypertension History of essential hypertension blood pressure measured today at 153 2/74.  I reviewed his blood pressure log from home and his blood pressures have been under good control.  He is on amlodipine and losartan as well as metoprolol.  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  He is asymptomatic and does not have an outflow tract murmur.  He apparently does have mild to moderate aortic stenosis and AI with a valve area of 1.7 cm.  I am going to recheck a 2D echocardiogram.  Hyperlipidemia History of hyperlipidemia on Repatha with lipid profile performed 08/09/2022 revealing total cholesterol 114, LDL of 52 and HDL 45.  Coronary artery disease History of CAD with a coronary calcium score of 734 performed 03/22/2022 with a negative FFR suggesting nonphysiologically  significant disease.  He is at goal for secondary prevention.     Runell Gess MD FACP,FACC,FAHA, [facility] 09/27/2022 8:41 AM

## 2022-10-10 DIAGNOSIS — H31092 Other chorioretinal scars, left eye: Secondary | ICD-10-CM | POA: Diagnosis not present

## 2022-10-10 DIAGNOSIS — H40053 Ocular hypertension, bilateral: Secondary | ICD-10-CM | POA: Diagnosis not present

## 2022-10-10 DIAGNOSIS — H43813 Vitreous degeneration, bilateral: Secondary | ICD-10-CM | POA: Diagnosis not present

## 2022-10-10 DIAGNOSIS — Z961 Presence of intraocular lens: Secondary | ICD-10-CM | POA: Diagnosis not present

## 2022-10-15 ENCOUNTER — Ambulatory Visit: Payer: Medicare PPO | Admitting: Cardiovascular Disease

## 2022-10-16 ENCOUNTER — Ambulatory Visit (HOSPITAL_COMMUNITY): Payer: Medicare PPO | Attending: Cardiovascular Disease

## 2022-10-16 DIAGNOSIS — I10 Essential (primary) hypertension: Secondary | ICD-10-CM | POA: Diagnosis not present

## 2022-10-16 DIAGNOSIS — Q231 Congenital insufficiency of aortic valve: Secondary | ICD-10-CM | POA: Diagnosis not present

## 2022-10-16 DIAGNOSIS — I2510 Atherosclerotic heart disease of native coronary artery without angina pectoris: Secondary | ICD-10-CM

## 2022-10-16 LAB — ECHOCARDIOGRAM COMPLETE
AR max vel: 1.55 cm2
AV Area VTI: 1.77 cm2
AV Area mean vel: 1.62 cm2
AV Mean grad: 19 mmHg
AV Peak grad: 31.8 mmHg
Ao pk vel: 2.82 m/s
Area-P 1/2: 4.58 cm2
P 1/2 time: 271 msec
S' Lateral: 3.1 cm

## 2022-10-17 ENCOUNTER — Other Ambulatory Visit: Payer: Self-pay | Admitting: Adult Health

## 2022-10-17 DIAGNOSIS — M545 Low back pain, unspecified: Secondary | ICD-10-CM

## 2022-10-17 DIAGNOSIS — M25551 Pain in right hip: Secondary | ICD-10-CM

## 2022-10-23 ENCOUNTER — Telehealth: Payer: Self-pay | Admitting: Cardiovascular Disease

## 2022-10-23 NOTE — Telephone Encounter (Signed)
Patient is calling to receive his echo results over the phone

## 2022-10-23 NOTE — Telephone Encounter (Signed)
Returned call to pt and LVM to return our call  Results are as per Dr. Allyson Sabal-  2D showed moderate Aortic Stenosis/Aortic Insufficiency with normal Left  Function (heart pumping). This is his first 2D here. Will need repeat 2D in 12 months

## 2022-10-24 NOTE — Telephone Encounter (Signed)
Returned call to pt and gave him the results per Dr. Allyson Sabal. Pt verbalized understanding and stated he saw the results on MyChart as well.

## 2023-01-02 ENCOUNTER — Encounter: Payer: Self-pay | Admitting: Adult Health

## 2023-01-15 ENCOUNTER — Other Ambulatory Visit: Payer: Self-pay | Admitting: Cardiovascular Disease

## 2023-02-19 ENCOUNTER — Other Ambulatory Visit: Payer: Self-pay | Admitting: Cardiovascular Disease

## 2023-03-29 ENCOUNTER — Other Ambulatory Visit: Payer: Self-pay | Admitting: Cardiovascular Disease

## 2023-05-08 DIAGNOSIS — J3089 Other allergic rhinitis: Secondary | ICD-10-CM | POA: Diagnosis not present

## 2023-05-08 DIAGNOSIS — J301 Allergic rhinitis due to pollen: Secondary | ICD-10-CM | POA: Diagnosis not present

## 2023-05-08 DIAGNOSIS — R062 Wheezing: Secondary | ICD-10-CM | POA: Diagnosis not present

## 2023-05-08 DIAGNOSIS — J3081 Allergic rhinitis due to animal (cat) (dog) hair and dander: Secondary | ICD-10-CM | POA: Diagnosis not present

## 2023-05-15 ENCOUNTER — Other Ambulatory Visit: Payer: Self-pay | Admitting: Cardiovascular Disease

## 2023-06-05 DIAGNOSIS — R3911 Hesitancy of micturition: Secondary | ICD-10-CM | POA: Diagnosis not present

## 2023-06-05 DIAGNOSIS — N5201 Erectile dysfunction due to arterial insufficiency: Secondary | ICD-10-CM | POA: Diagnosis not present

## 2023-06-05 DIAGNOSIS — N202 Calculus of kidney with calculus of ureter: Secondary | ICD-10-CM | POA: Diagnosis not present

## 2023-06-26 DIAGNOSIS — Z961 Presence of intraocular lens: Secondary | ICD-10-CM | POA: Diagnosis not present

## 2023-06-26 DIAGNOSIS — H40053 Ocular hypertension, bilateral: Secondary | ICD-10-CM | POA: Diagnosis not present

## 2023-07-15 DIAGNOSIS — L821 Other seborrheic keratosis: Secondary | ICD-10-CM | POA: Diagnosis not present

## 2023-07-15 DIAGNOSIS — D1801 Hemangioma of skin and subcutaneous tissue: Secondary | ICD-10-CM | POA: Diagnosis not present

## 2023-07-15 DIAGNOSIS — D3617 Benign neoplasm of peripheral nerves and autonomic nervous system of trunk, unspecified: Secondary | ICD-10-CM | POA: Diagnosis not present

## 2023-07-15 DIAGNOSIS — L304 Erythema intertrigo: Secondary | ICD-10-CM | POA: Diagnosis not present

## 2023-07-15 DIAGNOSIS — Z85828 Personal history of other malignant neoplasm of skin: Secondary | ICD-10-CM | POA: Diagnosis not present

## 2023-07-29 NOTE — Progress Notes (Signed)
   07/29/2023  Patient ID: Chad Dupont Sr., male   DOB: 11-27-1945, 78 y.o.   MRN: [id number]  Pharmacy Quality Measure Review  This patient is appearing on a report for being at risk of failing the adherence measure for hypertension (ACEi/ARB) medications this calendar year.   Medication: Losartan   Last fill date: 07/16/23 for 90 day supply  Insurance report was not up to date. No action needed at this time.   Carnell Christian, PharmD Clinical Pharmacist [phone]

## 2023-08-13 ENCOUNTER — Ambulatory Visit: Payer: Self-pay | Admitting: Adult Health

## 2023-08-13 ENCOUNTER — Encounter: Payer: Self-pay | Admitting: Adult Health

## 2023-08-13 ENCOUNTER — Ambulatory Visit (INDEPENDENT_AMBULATORY_CARE_PROVIDER_SITE_OTHER): Admitting: Adult Health

## 2023-08-13 VITALS — BP 160/60 | HR 88 | Temp 97.7°F | Ht 68.0 in | Wt 164.0 lb

## 2023-08-13 DIAGNOSIS — N400 Enlarged prostate without lower urinary tract symptoms: Secondary | ICD-10-CM | POA: Diagnosis not present

## 2023-08-13 DIAGNOSIS — I10 Essential (primary) hypertension: Secondary | ICD-10-CM

## 2023-08-13 DIAGNOSIS — Z0000 Encounter for general adult medical examination without abnormal findings: Secondary | ICD-10-CM | POA: Diagnosis not present

## 2023-08-13 DIAGNOSIS — J4541 Moderate persistent asthma with (acute) exacerbation: Secondary | ICD-10-CM

## 2023-08-13 DIAGNOSIS — E785 Hyperlipidemia, unspecified: Secondary | ICD-10-CM

## 2023-08-13 LAB — COMPREHENSIVE METABOLIC PANEL WITH GFR
ALT: 13 U/L (ref 0–53)
AST: 18 U/L (ref 0–37)
Albumin: 4.2 g/dL (ref 3.5–5.2)
Alkaline Phosphatase: 59 U/L (ref 39–117)
BUN: 21 mg/dL (ref 6–23)
CO2: 29 meq/L (ref 19–32)
Calcium: 9.1 mg/dL (ref 8.4–10.5)
Chloride: 107 meq/L (ref 96–112)
Creatinine, Ser: 1.04 mg/dL (ref 0.40–1.50)
GFR: 69.19 mL/min (ref >60.00)
Glucose, Bld: 109 mg/dL — ABNORMAL HIGH (ref 70–99)
Potassium: 3.7 meq/L (ref 3.5–5.1)
Sodium: 142 meq/L (ref 135–145)
Total Bilirubin: 1.1 mg/dL (ref 0.2–1.2)
Total Protein: 6.5 g/dL (ref 6.0–8.3)

## 2023-08-13 LAB — CBC
HCT: 41.4 % (ref 39.0–52.0)
Hemoglobin: 14.1 g/dL (ref 13.0–17.0)
MCHC: 34.1 g/dL (ref 30.0–36.0)
MCV: 86.6 fl (ref 78.0–100.0)
Platelets: 235 10*3/uL (ref 150.0–400.0)
RBC: 4.78 Mil/uL (ref 4.22–5.81)
RDW: 13.3 % (ref 11.5–15.5)
WBC: 7.6 10*3/uL (ref 4.0–10.5)

## 2023-08-13 LAB — TSH: TSH: 2.42 u[IU]/mL (ref 0.35–5.50)

## 2023-08-13 LAB — LIPID PANEL
Cholesterol: 96 mg/dL (ref 0–200)
HDL: 48.8 mg/dL (ref >39.00)
LDL Cholesterol: 33 mg/dL (ref 0–99)
NonHDL: 47.07
Total CHOL/HDL Ratio: 2
Triglycerides: 69 mg/dL (ref 0.0–149.0)
VLDL: 13.8 mg/dL (ref 0.0–40.0)

## 2023-08-13 NOTE — Progress Notes (Signed)
 Subjective:    Patient ID: Chad Crank., male    DOB: [birth year], 78 y.o.   MRN: [id number]  HPI Patient presents for yearly preventative medicine examination. He is a pleasant 78 year old male who  has a past medical history of AR (allergic rhinitis), Bilateral club feet, BPH (benign prostatic hyperplasia), FUNGAL DERMATITIS (11/30/2009), HYPERTENSION (01/28/2007), Kidney stone, and PILONIDAL CYST, WITH ABSCESS (08/06/2007).  Hypertension-managed with Cozaar  100 mg daily, Norvasc  2.5 mg daily, and Lopressor  50 mg daily.    He does monitor his blood pressure at home periodically and reports readings in the 130s to 140s over 70s to 80s.  He denies chest pain, shortness of breath, dizziness, lightheadedness, or blurred vision BP Readings from Last 3 Encounters:  08/13/23 (!) 160/60  09/27/22 (!) 152/74  08/22/22 (!) 170/50   Asthma-mild/intermittent.  Uses an albuterol  inhaler as needed.  He is seen by [facility]  BPH-managed by [facility].  Prescribed finasteride 5 mg daily and Flomax 0.4 mg daily PRN.   Hyperlipidemia-has been statin intolerant to multiple statins.  He had diarrhea from each one of them. Currently managed with Repatha  by Cardiology and tolerates this well.   Lab Results  Component Value Date   CHOL 114 08/09/2022   HDL 45.30 08/09/2022   LDLCALC 52 08/09/2022   LDLDIRECT 188.9 02/12/2012   TRIG 83.0 08/09/2022   CHOLHDL 3 08/09/2022    All immunizations and health maintenance protocols were reviewed with the patient and needed orders were placed.  Appropriate screening laboratory values were ordered for the patient including screening of hyperlipidemia, renal function and hepatic function. If indicated by BPH, a PSA was ordered.  Medication reconciliation,  past medical history, social history, problem list and allergies were reviewed in detail with the patient  Goals were established with regard to weight loss, exercise, and   diet in compliance with medications. He is walking a minimum of 2.5 miles a day 7 x a week and is eating healthy.   Wt Readings from Last 3 Encounters:  08/13/23 164 lb (74.4 kg)  09/27/22 165 lb 8 oz (75.1 kg)  08/22/22 163 lb (73.9 kg)    Review of Systems  Constitutional: Negative.   HENT: Negative.    Eyes: Negative.   Respiratory: Negative.    Cardiovascular: Negative.   Gastrointestinal: Negative.   Endocrine: Negative.   Genitourinary: Negative.   Musculoskeletal: Negative.   Skin: Negative.   Allergic/Immunologic: Negative.   Neurological: Negative.   Hematological: Negative.   Psychiatric/Behavioral: Negative.    All other systems reviewed and are negative.  Past Medical History:  Diagnosis Date   AR (allergic rhinitis)    Bilateral club feet    surgery and slinting as child   BPH (benign prostatic hyperplasia)    FUNGAL DERMATITIS 11/30/2009   HYPERTENSION 01/28/2007   Kidney stone    06/2009   PILONIDAL CYST, WITH ABSCESS 08/06/2007    Social History   Socioeconomic History   Marital status: Married    Spouse name: Not on file   Number of children: Not on file   Years of education: Not on file   Highest education level: Not on file  Occupational History   Not on file  Tobacco Use   Smoking status: Never   Smokeless tobacco: Never  Vaping Use   Vaping status: Never Used  Substance and Sexual Activity   Alcohol use: Yes    Comment: rarley    Drug  use: Never   Sexual activity: Not on file  Other Topics Concern   Not on file  Social History Narrative   Not on file   Social Drivers of Health   Financial Resource Strain: Low Risk  (05/16/2020)   Overall Financial Resource Strain (CARDIA)    Difficulty of Paying Living Expenses: Not hard at all  Food Insecurity: No Food Insecurity (05/16/2020)   Hunger Vital Sign    Worried About Running Out of Food in the Last Year: Never true    Ran Out of Food in the Last Year: Never true  Transportation Needs:  No Transportation Needs (01/19/2022)   Received from [facility]   OASIS A1250: Transportation    Lack of Transportation (Medical): No    Lack of Transportation (Non-Medical): No    Patient Unable or Declines to Respond: No  Physical Activity: Sufficiently Active (05/16/2020)   Exercise Vital Sign    Days of Exercise per Week: 7 days    Minutes of Exercise per Session: 40 min  Stress: No Stress Concern Present (05/16/2020)   [employer] of Occupational Health - Occupational Stress Questionnaire    Feeling of Stress : Not at all  Social Connections: Feeling Socially Integrated (01/19/2022)   Received from [facility]   OASIS D0700: Social Isolation    Frequency of experiencing loneliness or isolation: Never  Intimate Partner Violence: Not At Risk (05/16/2020)   Humiliation, Afraid, Rape, and Kick questionnaire    Fear of Current or Ex-Partner: No    Emotionally Abused: No    Physically Abused: No    Sexually Abused: No    Past Surgical History:  Procedure Laterality Date   CLUB FOOT RELEASE     bilat - as a child    Family History  Problem Relation Age of Onset   Cancer Neg Hx        colon ca   Hypertension Neg Hx        familyl hx    Allergies  Allergen Reactions   Codeine     REACTION: N\T\ V   Statins Diarrhea    Pt stated, this gave me uncontrollable bowels   Sulfonamide Derivatives     REACTION: rash    Current Outpatient Medications on File Prior to Visit  Medication Sig Dispense Refill   amLODipine  (NORVASC ) 2.5 MG tablet TAKE 1 TABLET BY MOUTH EVERY DAY 90 tablet 2   amoxicillin  (AMOXIL ) 500 MG tablet TAKE 4 TABLETS (2,000 MG TOTAL) BY MOUTH AS NEEDED. TAKE 30 TO 60 MINUTES PRIOR TO DENTAL PROCEDURES. 4 tablet 4   aspirin EC 81 MG tablet Take 81 mg by mouth daily.     desloratadine (CLARINEX) 5 MG tablet Take 5 mg by mouth as needed.     Evolocumab  (REPATHA  SURECLICK) 140 MG/ML SOAJ INJECT 140 MG INTO THE SKIN EVERY 14 (FOURTEEN) DAYS. 6 mL 3    finasteride (PROSCAR) 5 MG tablet Take 5 mg by mouth daily.     Ibuprofen 200 MG CAPS Take 200 mg by mouth daily as needed.     ipratropium (ATROVENT) 0.03 % nasal spray Place 2 sprays into both nostrils 3 (three) times daily as needed.     latanoprost (XALATAN) 0.005 % ophthalmic solution Place 1 drop into both eyes at bedtime.     losartan  (COZAAR ) 100 MG tablet Take 1 tablet (100 mg total) by mouth daily. 90 tablet 3   metoprolol  tartrate (LOPRESSOR ) 50 MG tablet TAKE 1 TABLET BY MOUTH EVERY  DAY 90 tablet 3   Multiple Vitamins-Minerals (MENS 50+ ADVANCED PO) Take 1 tablet by mouth daily.     PROVENTIL  HFA 108 (90 Base) MCG/ACT inhaler Inhale 2 puffs into the lungs every 6 (six) hours as needed.     silodosin (RAPAFLO) 8 MG CAPS capsule Take 8 mg by mouth daily.     tamsulosin (FLOMAX) 0.4 MG CAPS capsule Take 0.4 mg by mouth daily as needed.     triamcinolone  cream (KENALOG ) 0.1 % Apply topically 2 (two) times daily. 30 g 3   No current facility-administered medications on file prior to visit.    BP (!) 160/60   Pulse 88   Temp 97.7 F (36.5 C) (Oral)   Ht 5' 8 (1.727 m)   Wt 164 lb (74.4 kg)   SpO2 97%   BMI 24.94 kg/m       Objective:   Physical Exam Vitals and nursing note reviewed.  Constitutional:      General: He is not in acute distress.    Appearance: Normal appearance. He is not ill-appearing.  HENT:     Head: Normocephalic and atraumatic.     Right Ear: Tympanic membrane, ear canal and external ear normal. There is no impacted cerumen.     Left Ear: Tympanic membrane, ear canal and external ear normal. There is no impacted cerumen.     Nose: Nose normal. No congestion or rhinorrhea.     Mouth/Throat:     Mouth: Mucous membranes are moist.     Pharynx: Oropharynx is clear.   Eyes:     Extraocular Movements: Extraocular movements intact.     Conjunctiva/sclera: Conjunctivae normal.     Pupils: Pupils are equal, round, and reactive to light.   Neck:      Vascular: No carotid bruit.   Cardiovascular:     Rate and Rhythm: Normal rate and regular rhythm.     Pulses: Normal pulses.     Heart sounds: No murmur heard.    No friction rub. No gallop.  Pulmonary:     Effort: Pulmonary effort is normal.     Breath sounds: Normal breath sounds.  Abdominal:     General: Abdomen is flat. Bowel sounds are normal. There is no distension.     Palpations: Abdomen is soft. There is no mass.     Tenderness: There is no abdominal tenderness. There is no guarding or rebound.     Hernia: No hernia is present.   Musculoskeletal:        General: Normal range of motion.     Cervical back: Normal range of motion and neck supple.  Lymphadenopathy:     Cervical: No cervical adenopathy.   Skin:    General: Skin is warm and dry.     Capillary Refill: Capillary refill takes less than 2 seconds.   Neurological:     General: No focal deficit present.     Mental Status: He is alert and oriented to person, place, and time.   Psychiatric:        Mood and Affect: Mood normal.        Behavior: Behavior normal.        Thought Content: Thought content normal.        Judgment: Judgment normal.       Assessment & Plan:  1. Routine general medical examination at a health care facility ([facility]) Today patient counseled on age appropriate routine health concerns for screening and prevention, each reviewed and up  to date or declined. Immunizations reviewed and up to date or declined. Labs ordered and reviewed. Risk factors for depression reviewed and negative. Hearing function and visual acuity are intact. ADLs screened and addressed as needed. Functional ability and level of safety reviewed and appropriate. Education, counseling and referrals performed based on assessed risks today. Patient provided with a copy of personalized plan for preventive services.   2. Essential hypertension - Better controlled at home per his logs. No change in medication  - Lipid panel;  Future - TSH; Future - CBC; Future - Comprehensive metabolic panel with GFR; Future  3. Moderate persistent asthma with exacerbation - Continue with inhalers.  - Lipid panel; Future - TSH; Future - CBC; Future - Comprehensive metabolic panel with GFR; Future  4. Benign prostatic hyperplasia without lower urinary tract symptoms - Controlled. Follow up with urology as directed  5. Hyperlipidemia, unspecified hyperlipidemia type - Continue with Repatha  - Lipid panel; Future - TSH; Future - CBC; Future - Comprehensive metabolic panel with GFR; Future  Alto Atta, NP

## 2023-08-13 NOTE — Patient Instructions (Signed)
 It was great seeing you today   We will follow up with you regarding your lab work   Please let me know if you need anything

## 2023-09-29 ENCOUNTER — Encounter: Payer: Self-pay | Admitting: Cardiovascular Disease

## 2023-09-29 ENCOUNTER — Ambulatory Visit: Attending: Cardiovascular Disease | Admitting: Cardiovascular Disease

## 2023-09-29 VITALS — BP 148/60 | HR 59 | Ht 68.0 in | Wt 166.7 lb

## 2023-09-29 DIAGNOSIS — Q2381 Bicuspid aortic valve: Secondary | ICD-10-CM

## 2023-09-29 DIAGNOSIS — I10 Essential (primary) hypertension: Secondary | ICD-10-CM | POA: Diagnosis not present

## 2023-09-29 DIAGNOSIS — E782 Mixed hyperlipidemia: Secondary | ICD-10-CM | POA: Diagnosis not present

## 2023-09-29 DIAGNOSIS — I2510 Atherosclerotic heart disease of native coronary artery without angina pectoris: Secondary | ICD-10-CM | POA: Diagnosis not present

## 2023-09-29 MED ORDER — LOSARTAN POTASSIUM 100 MG PO TABS: 100.0000 mg | ORAL_TABLET | Freq: Every day | ORAL | 3 refills | Status: AC

## 2023-09-29 NOTE — Assessment & Plan Note (Signed)
 History of hyperlipidemia on Repatha  with lipid profile performed 08/13/2023 revealing total cholesterol 96, LDL 33 and HDL 48.

## 2023-09-29 NOTE — Progress Notes (Signed)
 09/29/2023 Chad DELENA Schaffer Sr.   11-18-1945  [id number]  Primary Physician Nafziger, Darleene, NP Primary Cardiologist: Dorn JINNY Lesches MD [facility] [place] [place], [place]  HPI:  Chad DELENA Schaffer Sr. is a 78 y.o.   thin-appearing married Caucasian male father 2 children, grandfather 2 grandchildren who was referred by Joane Radar nurse practitioner because of recent history of endocarditis.  His brother Theophil Thivierge is a patient of mine as well.  He is retired from being a Quarry manager at [employer].  I last saw him in the office 09/27/2022.  His cardiac risk factors are notable for treated hypertension and hyperlipidemia.  He is not diabetic.  There is no family history of heart disease .He has never had a heart tach or stroke.  He is fairly active and does yard work and walks for 30 minutes a day.  He apparently had a dental cleaning in mid October and was hospitalized at [facility] in [place]  [facility] 12/02/2021 for 8 days diagnosed with aortic valve endocarditis.  He was treated with IV antibiotics for 6 weeks via PICC line.  His most recent 2D echo performed 01/31/2022 revealed normal LV systolic function, bicuspid aortic valve with moderate AI and mild to moderate AS with an aortic valve area 1.7 cm.  He did have a stress test notably on 01/07/2022 that was abnormal with a reversible defect in the distal anteroapical wall although he is completely asymptomatic.   Since I saw him in the office 1 year ago he has remained stable.  His 2D echocardiogram performed 10/16/2022 showed normal LV systolic function with moderate aortic insufficiency and moderate aortic stenosis.  He is very active and walks 8-12,000 steps a day and is completely asymptomatic.  He did have a coronary calcium score performed 03/22/2022 which was 734 with CTA showing nonobstructive CAD by FFR analysis.   Current Meds  Medication Sig   amLODipine  (NORVASC ) 2.5 MG tablet TAKE 1 TABLET BY MOUTH EVERY DAY    amoxicillin  (AMOXIL ) 500 MG tablet TAKE 4 TABLETS (2,000 MG TOTAL) BY MOUTH AS NEEDED. TAKE 30 TO 60 MINUTES PRIOR TO DENTAL PROCEDURES.   aspirin EC 81 MG tablet Take 81 mg by mouth daily.   desloratadine (CLARINEX) 5 MG tablet Take 5 mg by mouth as needed.   Evolocumab  (REPATHA  SURECLICK) 140 MG/ML SOAJ INJECT 140 MG INTO THE SKIN EVERY 14 (FOURTEEN) DAYS.   finasteride (PROSCAR) 5 MG tablet Take 5 mg by mouth daily.   Ibuprofen 200 MG CAPS Take 200 mg by mouth daily as needed.   ipratropium (ATROVENT) 0.03 % nasal spray Place 2 sprays into both nostrils 3 (three) times daily as needed.   latanoprost (XALATAN) 0.005 % ophthalmic solution Place 1 drop into both eyes at bedtime.   losartan  (COZAAR ) 100 MG tablet Take 1 tablet (100 mg total) by mouth daily.   metoprolol  tartrate (LOPRESSOR ) 50 MG tablet TAKE 1 TABLET BY MOUTH EVERY DAY   Multiple Vitamins-Minerals (MENS 50+ ADVANCED PO) Take 1 tablet by mouth daily.   PROVENTIL  HFA 108 (90 Base) MCG/ACT inhaler Inhale 2 puffs into the lungs every 6 (six) hours as needed.   tamsulosin (FLOMAX) 0.4 MG CAPS capsule Take 0.4 mg by mouth daily as needed.     Allergies  Allergen Reactions   Codeine     REACTION: N\T\ V   Statins Diarrhea    Pt stated, this gave me uncontrollable bowels   Sulfonamide Derivatives     REACTION:  rash    Social History   Socioeconomic History   Marital status: Married    Spouse name: Not on file   Number of children: Not on file   Years of education: Not on file   Highest education level: Not on file  Occupational History   Not on file  Tobacco Use   Smoking status: Never   Smokeless tobacco: Never  Vaping Use   Vaping status: Never Used  Substance and Sexual Activity   Alcohol use: Yes    Comment: rarley    Drug use: Never   Sexual activity: Not on file  Other Topics Concern   Not on file  Social History Narrative   Not on file   Social Drivers of Health   Financial Resource Strain: Low  Risk  (05/16/2020)   Overall Financial Resource Strain (CARDIA)    Difficulty of Paying Living Expenses: Not hard at all  Food Insecurity: No Food Insecurity (05/16/2020)   Hunger Vital Sign    Worried About Running Out of Food in the Last Year: Never true    Ran Out of Food in the Last Year: Never true  Transportation Needs: No Transportation Needs (01/19/2022)   Received from [facility]   OASIS A1250: Transportation    Lack of Transportation (Medical): No    Lack of Transportation (Non-Medical): No    Patient Unable or Declines to Respond: No  Physical Activity: Sufficiently Active (05/16/2020)   Exercise Vital Sign    Days of Exercise per Week: 7 days    Minutes of Exercise per Session: 40 min  Stress: No Stress Concern Present (05/16/2020)   [employer] of Occupational Health - Occupational Stress Questionnaire    Feeling of Stress : Not at all  Social Connections: Feeling Socially Integrated (01/19/2022)   Received from [facility]   OASIS D0700: Social Isolation    Frequency of experiencing loneliness or isolation: Never  Intimate Partner Violence: Not At Risk (05/16/2020)   Humiliation, Afraid, Rape, and Kick questionnaire    Fear of Current or Ex-Partner: No    Emotionally Abused: No    Physically Abused: No    Sexually Abused: No     Review of Systems: General: negative for chills, fever, night sweats or weight changes.  Cardiovascular: negative for chest pain, dyspnea on exertion, edema, orthopnea, palpitations, paroxysmal nocturnal dyspnea or shortness of breath Dermatological: negative for rash Respiratory: negative for cough or wheezing Urologic: negative for hematuria Abdominal: negative for nausea, vomiting, diarrhea, bright red blood per rectum, melena, or hematemesis Neurologic: negative for visual changes, syncope, or dizziness All other systems reviewed and are otherwise negative except as noted above.    Blood pressure (!) 148/60, pulse (!) 59,  height 5' 8 (1.727 m), weight 166 lb 11.2 oz (75.6 kg), SpO2 96%.  General appearance: alert and no distress Neck: no adenopathy, no JVD, supple, symmetrical, trachea midline, thyroid  not enlarged, symmetric, no tenderness/mass/nodules, and bilateral carotid bruit bruits versus transmitted murmur. Lungs: clear to auscultation bilaterally Heart: 2/6 outflow tract murmur as well as decrescendo diastolic murmur at the left upper sternal border consistent with aortic stenosis and aortic insufficiency. Extremities: extremities normal, atraumatic, no cyanosis or edema Pulses: 2+ and symmetric Skin: Skin color, texture, turgor normal. No rashes or lesions Neurologic: Grossly normal  EKG EKG Interpretation Date/Time:  Monday September 29 2023 08:39:23 EDT Ventricular Rate:  59 PR Interval:  162 QRS Duration:  94 QT Interval:  412 QTC Calculation: 407 R Axis:  4  Text Interpretation: Sinus bradycardia When compared with ECG of 27-Sep-2022 08:11, No significant change was found Confirmed by Court Carrier [phone]) on 09/29/2023 9:10:15 AM    ASSESSMENT AND PLAN:   Essential hypertension History of essential hypertension with blood pressure measured today at 148/60.  He did provide a blood pressure log at home that shows his blood pressure is in the 120-130 range for the most part over 40s to 50s.  He is on low-dose amlodipine , losartan  and metoprolol .  Hyperlipidemia History of hyperlipidemia on Repatha  with lipid profile performed 08/13/2023 revealing total cholesterol 96, LDL 33 and HDL 48.  Bicuspid aortic valve History of bicuspid aortic valve with aortic valve endocarditis in the past treated with prolonged IV antibiotics through a PICC line.  His most recent 2D echocardiogram performed 10/16/2022 revealed normal LV systolic function, bicuspid aortic valve with moderate aortic insufficiency and moderate aortic stenosis with a valve area of 1.77 cm.  He is very active and completely  asymptomatic.  Will continue to follow him noninvasively on an annual basis.  Coronary artery disease History of CAD with a coronary calcium score 734 and negative FFR analysis.  He has a goal for secondary prevention and is completely asymptomatic.     Carrier DOROTHA Court MD FACP,FACC,FAHA, [facility] 09/29/2023 9:19 AM

## 2023-09-29 NOTE — Assessment & Plan Note (Signed)
 History of essential hypertension with blood pressure measured today at 148/60.  He did provide a blood pressure log at home that shows his blood pressure is in the 120-130 range for the most part over 40s to 50s.  He is on low-dose amlodipine , losartan  and metoprolol .

## 2023-09-29 NOTE — Assessment & Plan Note (Signed)
 History of bicuspid aortic valve with aortic valve endocarditis in the past treated with prolonged IV antibiotics through a PICC line.  His most recent 2D echocardiogram performed 10/16/2022 revealed normal LV systolic function, bicuspid aortic valve with moderate aortic insufficiency and moderate aortic stenosis with a valve area of 1.77 cm.  He is very active and completely asymptomatic.  Will continue to follow him noninvasively on an annual basis.

## 2023-09-29 NOTE — Assessment & Plan Note (Signed)
 History of CAD with a coronary calcium score 734 and negative FFR analysis.  He has a goal for secondary prevention and is completely asymptomatic.

## 2023-09-29 NOTE — Patient Instructions (Signed)
 Medication Instructions:  Your physician recommends that you continue on your current medications as directed. Please refer to the Current Medication list given to you today.  *If you need a refill on your cardiac medications before your next appointment, please call your pharmacy*  Testing/Procedures: Your physician has requested that you have an echocardiogram. Echocardiography is a painless test that uses sound waves to create images of your heart. It provides your doctor with information about the size and shape of your heart and how well your heart's chambers and valves are working. This procedure takes approximately one hour. There are no restrictions for this procedure. Please do NOT wear cologne, perfume, aftershave, or lotions (deodorant is allowed). Please arrive 15 minutes prior to your appointment time.  Please note: We ask at that you not bring children with you during ultrasound (echo/ vascular) testing. Due to room size and safety concerns, children are not allowed in the ultrasound rooms during exams. Our front office staff cannot provide observation of children in our lobby area while testing is being conducted. An adult accompanying a patient to their appointment will only be allowed in the ultrasound room at the discretion of the ultrasound technician under special circumstances. We apologize for any inconvenience.   Follow-Up: At [facility], you and your health needs are our priority.  As part of our continuing mission to provide you with exceptional heart care, our providers are all part of one team.  This team includes your primary Cardiologist (physician) and Advanced Practice Providers or APPs (Physician Assistants and Nurse Practitioners) who all work together to provide you with the care you need, when you need it.  Your next appointment:   12 month(s)  Provider:   Lauro Portal, MD    We recommend signing up for the patient portal called MyChart.  Sign  up information is provided on this After Visit Summary.  MyChart is used to connect with patients for Virtual Visits (Telemedicine).  Patients are able to view lab/test results, encounter notes, upcoming appointments, etc.  Non-urgent messages can be sent to your provider as well.   To learn more about what you can do with MyChart, go to [email or web address].

## 2023-11-26 ENCOUNTER — Other Ambulatory Visit: Payer: Self-pay | Admitting: Cardiovascular Disease

## 2024-01-05 ENCOUNTER — Ambulatory Visit: Payer: Self-pay | Admitting: Cardiovascular Disease

## 2024-01-05 ENCOUNTER — Ambulatory Visit (HOSPITAL_COMMUNITY)
Admission: RE | Admit: 2024-01-05 | Discharge: 2024-01-05 | Disposition: A | Discharge status: Home / Self Care | Source: Ambulatory Visit | Attending: Cardiovascular Disease | Admitting: Cardiovascular Disease

## 2024-01-05 DIAGNOSIS — I2510 Atherosclerotic heart disease of native coronary artery without angina pectoris: Secondary | ICD-10-CM | POA: Insufficient documentation

## 2024-01-05 DIAGNOSIS — I10 Essential (primary) hypertension: Secondary | ICD-10-CM | POA: Insufficient documentation

## 2024-01-05 DIAGNOSIS — Q2381 Bicuspid aortic valve: Secondary | ICD-10-CM | POA: Insufficient documentation

## 2024-01-05 DIAGNOSIS — I350 Nonrheumatic aortic (valve) stenosis: Secondary | ICD-10-CM | POA: Diagnosis not present

## 2024-01-05 DIAGNOSIS — E782 Mixed hyperlipidemia: Secondary | ICD-10-CM | POA: Insufficient documentation

## 2024-01-05 LAB — ECHOCARDIOGRAM COMPLETE
AR max vel: 1.39 cm2
AV Area VTI: 1.35 cm2
AV Area mean vel: 1.34 cm2
AV Mean grad: 18 mmHg
AV Peak grad: 31.1 mmHg
Ao pk vel: 2.79 m/s
Area-P 1/2: 4.15 cm2
P 1/2 time: 317 ms
S' Lateral: 3.6 cm

## 2024-01-29 DIAGNOSIS — H40053 Ocular hypertension, bilateral: Secondary | ICD-10-CM | POA: Diagnosis not present

## 2024-01-29 DIAGNOSIS — H35033 Hypertensive retinopathy, bilateral: Secondary | ICD-10-CM | POA: Diagnosis not present

## 2024-01-29 DIAGNOSIS — H31092 Other chorioretinal scars, left eye: Secondary | ICD-10-CM | POA: Diagnosis not present

## 2024-01-29 DIAGNOSIS — H3562 Retinal hemorrhage, left eye: Secondary | ICD-10-CM | POA: Diagnosis not present

## 2024-01-29 DIAGNOSIS — Z961 Presence of intraocular lens: Secondary | ICD-10-CM | POA: Diagnosis not present

## 2024-02-10 ENCOUNTER — Other Ambulatory Visit: Payer: Self-pay | Admitting: Cardiovascular Disease

## 2024-02-11 NOTE — Telephone Encounter (Signed)
 Pt of Dr. Court. Does Dr. Court want to refill Amoxicillin ? Please advise.

## 2024-02-13 NOTE — Telephone Encounter (Signed)
"   Amoxicillin  can be refilled   History of endocarditis   Bicuspid aortic valve "

## 2024-03-30 ENCOUNTER — Ambulatory Visit: Admitting: Adult Health

## 2024-03-30 ENCOUNTER — Encounter: Payer: Self-pay | Admitting: Adult Health

## 2024-03-30 VITALS — BP 170/60 | HR 67 | Temp 98.6°F | Ht 68.0 in | Wt 160.0 lb

## 2024-03-30 DIAGNOSIS — R051 Acute cough: Secondary | ICD-10-CM

## 2024-03-30 MED ORDER — BENZONATATE 200 MG PO CAPS: 200.0000 mg | ORAL_CAPSULE | Freq: Three times a day (TID) | ORAL | 1 refills | Status: AC | PRN

## 2024-03-30 MED ORDER — HYDROCODONE BIT-HOMATROP MBR 5-1.5 MG/5ML PO SOLN: 5.0000 mL | Freq: Three times a day (TID) | ORAL | 0 refills | Status: AC | PRN

## 2024-03-30 NOTE — Progress Notes (Signed)
 "  Subjective:    Patient ID: Chad DELENA Donal Chrystal., male    DOB: [birth year], 79 y.o.   MRN: [id number]  HPI Discussed the use of AI scribe software for clinical note transcription with the patient, who gave verbal consent to proceed.  History of Present Illness   Chad OFARRELL Sr. is a 79 year old male who presents with a persistent cough.  He has a persistent dry cough that disrupts his sleep x 3 days.   His wife had a similar cough last week and has improved.  He denies shortness of breath, wheezing, fever, or chills.  He has not used cough medicine. He uses an inhalant and a small blue pill from his allergist, which he feels help, though he cannot recall the pills name.       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   HYPERTENSION 01/28/2007   Kidney stone    06/2009   PILONIDAL CYST, WITH ABSCESS 08/06/2007    Social History   Socioeconomic History   Marital status: Married    Spouse name: Not on file   Number of children: Not on file   Years of education: Not on file   Highest education level: Not on file  Occupational History   Not on file  Tobacco Use   Smoking status: Never   Smokeless tobacco: Never  Vaping Use   Vaping status: Never Used  Substance and Sexual Activity   Alcohol use: Yes    Comment: rarley    Drug use: Never   Sexual activity: Not on file  Other Topics Concern   Not on file  Social History Narrative   Not on file   Social Drivers of Health   Tobacco Use: Low Risk (03/30/2024)   Patient History    Smoking Tobacco Use: Never    Smokeless Tobacco Use: Never    Passive Exposure: Not on file  Financial Resource Strain: Not on file  Food Insecurity: Not on file  Transportation Needs: No Transportation Needs (01/19/2022)   Received from [facility]   OASIS  A1250: Transportation    Lack of Transportation (Medical): No    Lack of Transportation (Non-Medical): No    Patient Unable or Declines to Respond: No  Physical Activity: Not on file  Stress: Not on file  Social Connections: Feeling Socially Integrated (01/19/2022)   Received from [facility]   OASIS D0700: Social Isolation    Frequency of experiencing loneliness or isolation: Never  Intimate Partner Violence: Not on file  Depression (PHQ2-9): Low Risk (08/13/2023)   Depression (PHQ2-9)    PHQ-2 Score: 0  Alcohol Screen: Not on file  Housing: Not on file  Utilities: Not on file  Health Literacy: Adequate Health Literacy (01/19/2022)   Received from [facility]   OASIS B1300: Health Literacy    Frequency of needing help to read materials from doctor or pharmacy: Never    Past Surgical History:  Procedure Laterality Date   CLUB FOOT RELEASE     bilat - as a child    Family History  Problem Relation Age of Onset   Cancer Neg Hx        colon ca   Hypertension Neg Hx        familyl hx  Allergies[1]  Medications Ordered Prior to Encounter[2]  BP (!) 170/60   Pulse 67   Temp 98.6 F (37 C) (Oral)   Ht 5' 8 (1.727 m)   Wt 160 lb (72.6 kg)   SpO2 98%   BMI 24.33 kg/m       Objective:   Physical Exam Vitals and nursing note reviewed.  Constitutional:      Appearance: Normal appearance.  HENT:     Nose: Nose normal. No congestion or rhinorrhea.     Mouth/Throat:     Mouth: Mucous membranes are moist.     Pharynx: Oropharynx is clear. No oropharyngeal exudate or posterior oropharyngeal erythema.  Cardiovascular:     Rate and Rhythm: Normal rate and regular rhythm.     Pulses: Normal pulses.     Heart sounds: Normal heart sounds.  Pulmonary:     Effort: Pulmonary effort is normal.     Breath sounds: Normal breath sounds.  Skin:    General: Skin is warm and dry.     Capillary Refill: Capillary refill takes less than 2 seconds.  Neurological:      General: No focal deficit present.     Mental Status: He is alert and oriented to person, place, and time.  Psychiatric:        Mood and Affect: Mood normal.        Behavior: Behavior normal.        Thought Content: Thought content normal.        Judgment: Judgment normal.        Assessment & Plan:  Assessment and Plan    Acute Cough Likely viral. Dry, non-productive, nocturnal cough causing sleep disturbances. No dyspnea, wheezing, fever, or chills. - Prescribed hycodan cough syrup for symptomatic relief. - Prescribed Tessalon  Perles for cough management. - Follow up if not improving over the next 7-10 days or sooner if symptoms worsen       Darleene Shape, NP      [1]  Allergies Allergen Reactions   Codeine     REACTION: N\T\ V   Statins Diarrhea    Pt stated, this gave me uncontrollable bowels   Sulfonamide Derivatives     REACTION: rash  [2]  Current Outpatient Medications on File Prior to Visit  Medication Sig Dispense Refill   amLODipine  (NORVASC ) 2.5 MG tablet TAKE 1 TABLET BY MOUTH EVERY DAY 90 tablet 3   amoxicillin  (AMOXIL ) 500 MG tablet TAKE 4 TABLETS BY MOUTH AS NEEDED. TAKE 30 TO 60 MINUTES PRIOR TO DENTAL PROCEDURES. 4 tablet 4   aspirin EC 81 MG tablet Take 81 mg by mouth daily.     desloratadine (CLARINEX) 5 MG tablet Take 5 mg by mouth as needed.     Evolocumab  (REPATHA  SURECLICK) 140 MG/ML SOAJ INJECT 140 MG INTO THE SKIN EVERY 14 (FOURTEEN) DAYS. 6 mL 3   finasteride (PROSCAR) 5 MG tablet Take 5 mg by mouth daily.     Ibuprofen 200 MG CAPS Take 200 mg by mouth daily as needed.     ipratropium (ATROVENT) 0.03 % nasal spray Place 2 sprays into both nostrils 3 (three) times daily as needed.     latanoprost (XALATAN) 0.005 % ophthalmic solution Place 1 drop into both eyes at bedtime.     losartan  (COZAAR ) 100 MG tablet Take 1 tablet (100 mg total) by mouth daily. 90 tablet 3   metoprolol  tartrate (LOPRESSOR ) 50 MG tablet TAKE 1 TABLET BY MOUTH EVERY  DAY 90 tablet 3  Multiple Vitamins-Minerals (MENS 50+ ADVANCED PO) Take 1 tablet by mouth daily.     PROVENTIL  HFA 108 (90 Base) MCG/ACT inhaler Inhale 2 puffs into the lungs every 6 (six) hours as needed.     silodosin (RAPAFLO) 8 MG CAPS capsule Take 8 mg by mouth daily.     tamsulosin (FLOMAX) 0.4 MG CAPS capsule Take 0.4 mg by mouth daily as needed.     No current facility-administered medications on file prior to visit.   "

## 2024-04-01 ENCOUNTER — Other Ambulatory Visit: Payer: Self-pay | Admitting: Cardiovascular Disease
